# Patient Record
Sex: Male | Born: 1955 | Hispanic: Refuse to answer | Smoking: Current every day smoker
Health system: Southern US, Community
[De-identification: ages and names within clinical notes are randomized; demographics above are authoritative.]

---

## 2003-12-27 ENCOUNTER — Emergency Department (HOSPITAL_COMMUNITY): Admission: EM | Admit: 2003-12-27 | Discharge: 2003-12-27 | Payer: Self-pay | Admitting: Emergency Medicine
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
## 2009-08-13 ENCOUNTER — Ambulatory Visit: Payer: Self-pay | Admitting: Internal Medicine

## 2009-08-23 ENCOUNTER — Ambulatory Visit: Payer: Self-pay | Admitting: Internal Medicine

## 2021-12-30 ENCOUNTER — Other Ambulatory Visit: Payer: Self-pay | Admitting: Registered Nurse

## 2021-12-30 DIAGNOSIS — F17210 Nicotine dependence, cigarettes, uncomplicated: Secondary | ICD-10-CM

## 2021-12-30 DIAGNOSIS — Z136 Encounter for screening for cardiovascular disorders: Secondary | ICD-10-CM

## 2021-12-30 DIAGNOSIS — Z87891 Personal history of nicotine dependence: Secondary | ICD-10-CM

## 2021-12-31 ENCOUNTER — Ambulatory Visit: Payer: Self-pay

## 2022-01-01 ENCOUNTER — Ambulatory Visit: Payer: Self-pay

## 2022-01-21 ENCOUNTER — Inpatient Hospital Stay: Admission: RE | Admit: 2022-01-21 | Payer: Self-pay | Source: Ambulatory Visit

## 2022-01-27 ENCOUNTER — Ambulatory Visit (HOSPITAL_COMMUNITY)
Admission: EM | Admit: 2022-01-27 | Discharge: 2022-01-28 | Disposition: A | Discharge status: Other Acute Inpatient Hospital | Payer: Medicare HMO | Attending: Nurse Practitioner | Admitting: Nurse Practitioner

## 2022-01-27 DIAGNOSIS — Z20822 Contact with and (suspected) exposure to covid-19: Secondary | ICD-10-CM | POA: Insufficient documentation

## 2022-01-27 DIAGNOSIS — F1010 Alcohol abuse, uncomplicated: Secondary | ICD-10-CM | POA: Diagnosis not present

## 2022-01-27 DIAGNOSIS — F1990 Other psychoactive substance use, unspecified, uncomplicated: Secondary | ICD-10-CM | POA: Diagnosis present

## 2022-01-27 DIAGNOSIS — R45851 Suicidal ideations: Secondary | ICD-10-CM | POA: Diagnosis not present

## 2022-01-27 DIAGNOSIS — F323 Major depressive disorder, single episode, severe with psychotic features: Secondary | ICD-10-CM

## 2022-01-27 DIAGNOSIS — Z5900 Homelessness unspecified: Secondary | ICD-10-CM | POA: Diagnosis not present

## 2022-01-27 DIAGNOSIS — Z79899 Other long term (current) drug therapy: Secondary | ICD-10-CM | POA: Insufficient documentation

## 2022-01-27 DIAGNOSIS — F332 Major depressive disorder, recurrent severe without psychotic features: Secondary | ICD-10-CM | POA: Diagnosis present

## 2022-01-27 DIAGNOSIS — R251 Tremor, unspecified: Secondary | ICD-10-CM | POA: Diagnosis not present

## 2022-01-27 DIAGNOSIS — F209 Schizophrenia, unspecified: Secondary | ICD-10-CM | POA: Diagnosis present

## 2022-01-27 LAB — POCT URINE DRUG SCREEN - MANUAL ENTRY (I-SCREEN)
POC Amphetamine UR: NOT DETECTED
POC Buprenorphine (BUP): NOT DETECTED
POC Cocaine UR: POSITIVE — AB
POC Marijuana UR: POSITIVE — AB
POC Methadone UR: NOT DETECTED
POC Methamphetamine UR: NOT DETECTED
POC Morphine: NOT DETECTED
POC Oxazepam (BZO): NOT DETECTED
POC Oxycodone UR: NOT DETECTED
POC Secobarbital (BAR): NOT DETECTED

## 2022-01-27 LAB — POC SARS CORONAVIRUS 2 AG: SARSCOV2ONAVIRUS 2 AG: NEGATIVE

## 2022-01-27 MED ORDER — MELATONIN 3 MG PO TABS
3.0000 mg | ORAL_TABLET | Freq: Every day | ORAL | Status: DC
  Administered 2022-01-28: 3 mg via ORAL
  Filled 2022-01-27: qty 1

## 2022-01-27 MED ORDER — LOPERAMIDE HCL 2 MG PO CAPS: 2.0000 mg | ORAL_CAPSULE | ORAL | Status: DC | PRN

## 2022-01-27 MED ORDER — ADULT MULTIVITAMIN W/MINERALS CH
1.0000 | ORAL_TABLET | Freq: Every day | ORAL | Status: DC
  Administered 2022-01-28: 1 via ORAL
  Filled 2022-01-27: qty 1

## 2022-01-27 MED ORDER — TRAZODONE HCL 50 MG PO TABS: 50.0000 mg | ORAL_TABLET | Freq: Every evening | ORAL | Status: DC | PRN

## 2022-01-27 MED ORDER — ONDANSETRON 4 MG PO TBDP: 4.0000 mg | ORAL_TABLET | Freq: Four times a day (QID) | ORAL | Status: DC | PRN

## 2022-01-27 MED ORDER — ACETAMINOPHEN 325 MG PO TABS: 650.0000 mg | ORAL_TABLET | Freq: Four times a day (QID) | ORAL | Status: DC | PRN

## 2022-01-27 MED ORDER — MAGNESIUM HYDROXIDE 400 MG/5ML PO SUSP: 30.0000 mL | Freq: Every day | ORAL | Status: DC | PRN

## 2022-01-27 MED ORDER — ALUM & MAG HYDROXIDE-SIMETH 200-200-20 MG/5ML PO SUSP: 30.0000 mL | ORAL | Status: DC | PRN

## 2022-01-27 MED ORDER — THIAMINE HCL 100 MG PO TABS
100.0000 mg | ORAL_TABLET | Freq: Every day | ORAL | Status: DC
  Administered 2022-01-28: 100 mg via ORAL
  Filled 2022-01-27: qty 1

## 2022-01-27 MED ORDER — THIAMINE HCL 100 MG/ML IJ SOLN
100.0000 mg | Freq: Once | INTRAMUSCULAR | Status: AC
  Administered 2022-01-28: 100 mg via INTRAMUSCULAR
  Filled 2022-01-27: qty 2

## 2022-01-27 MED ORDER — LORAZEPAM 1 MG PO TABS
1.0000 mg | ORAL_TABLET | Freq: Four times a day (QID) | ORAL | Status: DC | PRN
  Administered 2022-01-28: 1 mg via ORAL
  Filled 2022-01-27: qty 1

## 2022-01-27 NOTE — ED Triage Notes (Signed)
Patient arrived at [facility] escorted by [employer]. Pt reports SI with a plan to stand in traffic. Per [employer] pt was staying at a hotel and the hotel manager called on his behalf because the pt was threatening suicide and attempted to run into the street. Pt states he constantly has auditory hallucinations with a commanding voice telling him to kill himself. Pt denies visual hallucinations at this moment. Pt is tearful during triage. Pt denies HI,NSSIB, and alcohol and drug use.

## 2022-01-27 NOTE — ED Provider Notes (Signed)
[facility] Continuous Assessment Admission H&P  Date: 01/27/22 Patient Name: Harry Jones MRN: [id number] Chief Complaint: " I was trying to kill myself".  Chief Complaint  Patient presents with   Schizophrenia   Suicidal      Diagnoses:  Final diagnoses:  Current severe episode of major depressive disorder with psychotic features, unspecified whether recurrent (HCC)  Suicidal ideation  Homelessness    HPI: Harry Jones is a 66 year old man who arrived voluntarily at the [facility] escorted by [employer] due to suicidal ideations with a plan to walk into traffic.  Pt reports he has a history of schizophrenia, Bipolar disorder and depression. Pt reports he became depressed seven years ago after his wife died. Pt reports he does not have any children or social support. Pt reports he sees a [facility] health provider at [facility] but is unable to recall her name or when he last visited. Pt is currently homeless and lives under a bridge for the past two years. Pt reports that he takes Xarelto 20 mg Po tabs daily " so my blood clots won't come back". Pt was also prescribed Cogentin 0.5 mg  daily tabs,  and eye drops, by the same provider at [facility]", but states " the demons told me not to take it".   Pt reports having constant command auditory hallucinations with voices telling him to kill himself and making him feel guilty if he didn't. Pt states " The voices say I didn't do it today, I didn't do what they told me to do". " I was on my way to the highway, I was trying to kill myself". Pt reports the voices come all the time and describes them as "demons".  Pt denies HI, denies VH.  The patient brought his current medication and states it was prescribed three weeks ago at " [facility]".  Pt reports his sleep and appetite as poor and states " I just want some help". " I want to get off the streets". Pt reports he drinks alcohol daily and does not count how many. Pt reports he takes cocaine, marijuana,  xanax and other drugs. Pt states " everybody who lives under the bridge do drugs and we just take whatever we can get, we don't count". Pt states he has been doing drugs for along time and regularly for the past two years he has lived under the bridge.   On evaluation, patient is alert, oriented x 2, guarded but cooperative. Speech is clear, and coherent. Pt appears disheveled with an odor. Eye contact is minimal. Mood is anxious, hopeless and depressed, affect is congruent with mood. Thought process and thought content is coherent. Pt endorses SI with plan, denies HI, endorses command AH, denies VH. There is indication that the patient is responding to internal stimuli. During this evaluation, the patient's head is in a downward gaze, and he waits for a few seconds after being asked a question before he responds. Pt also got up at one point in the assessment to rearrange the seat in the room and then sat down facing the wall with his arms clasped in his thighs. No delusions elicited during this assessment.    PHQ 2-9:     Total Time spent with patient: 20 minutes  Musculoskeletal  Strength & Muscle Tone:  tremors Gait & Station: normal Patient leans: N/A  Psychiatric Specialty Exam  Presentation General Appearance: Disheveled  Eye Contact:Minimal  Speech:Clear and Coherent  Speech Volume:Normal  Handedness:Right   Mood and  Affect  Mood:Anxious; Depressed; Hopeless  Affect:Congruent   Thought Process  Thought Processes:Coherent  Descriptions of Associations:Intact  Orientation:Partial  Thought Content:WDL  Diagnosis of Schizophrenia or Schizoaffective disorder in past: Yes  Duration of Psychotic Symptoms: Greater than six months  Hallucinations:Hallucinations: Auditory Description of Auditory Hallucinations: " They say I didn't do it today, I didn't do what they asked me, to kill myself".  Ideas of Reference:None  Suicidal Thoughts:Suicidal Thoughts: Yes, Active SI  Active Intent and/or Plan: With Intent; With Plan  Homicidal Thoughts:Homicidal Thoughts: No   Sensorium  Memory:Immediate Poor; Recent Poor  Judgment:Poor  Insight:Poor   Executive Functions  Concentration:Poor  Attention Span:Poor  Recall:Poor  Fund of Knowledge:Poor  Language:Fair   Psychomotor Activity  Psychomotor Activity:Psychomotor Activity: Normal   Assets  Assets:Communication Skills; Desire for Improvement   Sleep  Sleep:Sleep: Poor   Nutritional Assessment (For OBS and [facility] admissions only) Has the patient had a weight loss or gain of 10 pounds or more in the last 3 months?: No Has the patient had a decrease in food intake/or appetite?: No Does the patient have dental problems?: No Does the patient have eating habits or behaviors that may be indicators of an eating disorder including binging or inducing vomiting?: No Has the patient recently lost weight without trying?: 0 Has the patient been eating poorly because of a decreased appetite?: 0 Malnutrition Screening Tool Score: 0    Physical Exam Constitutional:      Appearance: He is not toxic-appearing or diaphoretic.  HENT:     Head: Normocephalic.     Right Ear: External ear normal.     Left Ear: External ear normal.     Nose: No congestion.  Eyes:     General:        Right eye: No discharge.        Left eye: Discharge present. Cardiovascular:     Rate and Rhythm: Normal rate.  Pulmonary:     Effort: No respiratory distress.  Chest:     Chest wall: No tenderness.  Neurological:     Mental Status: He is alert. Mental status is at baseline.  Psychiatric:        Attention and Perception: He perceives auditory hallucinations. He does not perceive visual hallucinations.        Mood and Affect: Mood is anxious and depressed.        Speech: Speech normal.        Behavior: Behavior is slowed. Behavior is cooperative.        Thought Content: Thought content is not paranoid or delusional.  Thought content includes suicidal ideation. Thought content does not include homicidal ideation. Thought content includes suicidal plan. Thought content does not include homicidal plan.        Judgment: Judgment is impulsive and inappropriate.    Review of Systems  Constitutional:  Negative for diaphoresis and fever.  HENT:  Negative for congestion.   Eyes:  Negative for pain and discharge.  Respiratory:  Negative for cough, shortness of breath and wheezing.   Cardiovascular:  Negative for chest pain and palpitations.  Gastrointestinal:  Negative for diarrhea, nausea and vomiting.  Neurological:  Positive for tremors. Negative for dizziness, seizures, loss of consciousness, weakness and headaches.  Psychiatric/Behavioral:  Positive for depression, hallucinations, substance abuse and suicidal ideas. The patient is nervous/anxious.     Blood pressure (!) 144/99, pulse 100, resp. rate 18, SpO2 92 %. There is no height or weight on file to calculate BMI.  Past Psychiatric History: Pt reports a history of schizophrenia, Bipolar and depression.  Is the patient at risk to self? Yes  Has the patient been a risk to self in the past 6 months? Yes .    Has the patient been a risk to self within the distant past? Yes   Is the patient a risk to others? No   Has the patient been a risk to others in the past 6 months? No   Has the patient been a risk to others within the distant past? No   Past Medical History: No past medical history on file.   Family History: No family history on file.  Social History:  Social History   Socioeconomic History   Marital status: Legally Separated    Spouse name: Not on file   Number of children: Not on file   Years of education: Not on file   Highest education level: Not on file  Occupational History   Not on file  Tobacco Use   Smoking status: Not on file   Smokeless tobacco: Not on file  Substance and Sexual Activity   Alcohol use: Not on file   Drug  use: Not on file   Sexual activity: Not on file  Other Topics Concern   Not on file  Social History Narrative   Not on file   Social Determinants of Health   Financial Resource Strain: Not on file  Food Insecurity: Not on file  Transportation Needs: Not on file  Physical Activity: Not on file  Stress: Not on file  Social Connections: Not on file  Intimate Partner Violence: Not on file    SDOH:  SDOH Screenings   Alcohol Screen: Not on file  Depression (PHQ2-9): Not on file  Financial Resource Strain: Not on file  Food Insecurity: Not on file  Housing: Not on file  Physical Activity: Not on file  Social Connections: Not on file  Stress: Not on file  Tobacco Use: Not on file  Transportation Needs: Not on file    Last Labs:  No visits with results within 6 Month(s) from this visit.  Latest known visit with results is:  No results found for any previous visit.    Allergies: Patient has no allergy information on record.  PTA Medications: (Not in a hospital admission)  Prior to Admission medications   Not on File    Medical Decision Making  Recommend in-patient psychiatric treatment for suicidal ideation, medication management and psychiatric services.  Pt will be admitted for continuous observation  for safety pending bed availability at the [facility]. [facility] notified. Pt agrees with plan for observation and admission while awaiting in-patient psych treatment.   Lab Orders         Resp Panel by RT-PCR (Flu A&B, Covid) Anterior Nasal Swab         CBC with Differential/Platelet         Comprehensive metabolic panel         Hemoglobin A1c         Lipid panel         TSH         Ethanol         POCT Urine Drug Screen - (I-Screen)      EKG.  Initiate CIWA protocol -lorazepam 1 mg every 6 hours prn for CIWA >10 -thiamine 100 mg daily for nutritional supplementation -hydroxyzine 25 mg every 6 hours prn for anxiety, CIWA < or = 10 -ondansetron 4  mg ODT every 6 hours  prn nausea/vomiting -loperamide 2-4 mg capsule prn diarrhea or loose stools      Recommendations  Based on my evaluation the patient does not appear to have an emergency medical condition.  Recommend in-patient psychiatric treatment. CIWA protocol for alcohol abuse.  Mancel Bale, NP 01/27/22  10:39 PM

## 2022-01-27 NOTE — Progress Notes (Addendum)
Received Harry Jones in the assessment room. He was cooperative with the admission process. Afterwards the skin assessment was completed and he was escorted to the unit. He was offered nourishments and accepted. He was standing at the bedside and verbalized the demons are telling him to stand there. He was tearful at an interval. He proceeded to verbalized he has been living under a bridge x2  years and the people he lives with is his family. His wife died 7 years ago of colon cancer and he gave up. He endorsed feeling suicidal related to his current situation. He stated his last drink was Friday and he drink 2 cases of beer with his  four friends.  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 He stated he paid on his wife  burial expenses. [facility] will give him a room next month when he can pay. His check is 1000.00 monthly. He cannot remember the medication he was taking for his diagnosis of schizophrenia, but he was taking Xarelto 20 mg QD for blood clots in his lungs. He has not been compliant with the medication. He does have the medication in his locker along with Cogentin 0.5 mg.He also has Dorzolamide 2%/0.5. He is hoping to go to a rehab facility.

## 2022-01-28 ENCOUNTER — Encounter (HOSPITAL_COMMUNITY): Payer: Self-pay | Admitting: Student

## 2022-01-28 ENCOUNTER — Other Ambulatory Visit: Payer: Self-pay

## 2022-01-28 ENCOUNTER — Inpatient Hospital Stay (HOSPITAL_COMMUNITY)
Admission: AD | Admit: 2022-01-28 | Discharge: 2022-02-05 | DRG: 885 | Disposition: A | Discharge status: Home / Self Care | Payer: Medicare HMO | Source: Intra-hospital | Attending: Psychiatry | Admitting: Psychiatry

## 2022-01-28 DIAGNOSIS — F1990 Other psychoactive substance use, unspecified, uncomplicated: Secondary | ICD-10-CM | POA: Diagnosis present

## 2022-01-28 DIAGNOSIS — F209 Schizophrenia, unspecified: Secondary | ICD-10-CM | POA: Diagnosis not present

## 2022-01-28 DIAGNOSIS — Z7901 Long term (current) use of anticoagulants: Secondary | ICD-10-CM

## 2022-01-28 DIAGNOSIS — F1010 Alcohol abuse, uncomplicated: Secondary | ICD-10-CM | POA: Diagnosis not present

## 2022-01-28 DIAGNOSIS — F17210 Nicotine dependence, cigarettes, uncomplicated: Secondary | ICD-10-CM | POA: Diagnosis present

## 2022-01-28 DIAGNOSIS — R45851 Suicidal ideations: Secondary | ICD-10-CM

## 2022-01-28 DIAGNOSIS — H409 Unspecified glaucoma: Secondary | ICD-10-CM | POA: Diagnosis present

## 2022-01-28 DIAGNOSIS — Z5900 Homelessness unspecified: Secondary | ICD-10-CM | POA: Diagnosis not present

## 2022-01-28 DIAGNOSIS — Z79899 Other long term (current) drug therapy: Secondary | ICD-10-CM

## 2022-01-28 DIAGNOSIS — F259 Schizoaffective disorder, unspecified: Principal | ICD-10-CM | POA: Diagnosis present

## 2022-01-28 DIAGNOSIS — Z5902 Unsheltered homelessness: Secondary | ICD-10-CM

## 2022-01-28 DIAGNOSIS — R319 Hematuria, unspecified: Secondary | ICD-10-CM | POA: Diagnosis present

## 2022-01-28 DIAGNOSIS — F323 Major depressive disorder, single episode, severe with psychotic features: Secondary | ICD-10-CM | POA: Diagnosis not present

## 2022-01-28 DIAGNOSIS — E569 Vitamin deficiency, unspecified: Secondary | ICD-10-CM | POA: Diagnosis present

## 2022-01-28 LAB — LIPID PANEL
Cholesterol: 187 mg/dL (ref 0–200)
HDL: 79 mg/dL (ref >40)
LDL Cholesterol: 99 mg/dL (ref 0–99)
Total CHOL/HDL Ratio: 2.4 RATIO
Triglycerides: 46 mg/dL (ref <150)
VLDL: 9 mg/dL (ref 0–40)

## 2022-01-28 LAB — CBC WITH DIFFERENTIAL/PLATELET
Abs Immature Granulocytes: 0.02 10*3/uL (ref 0.00–0.07)
Basophils Absolute: 0 10*3/uL (ref 0.0–0.1)
Basophils Relative: 0 %
Eosinophils Absolute: 0.1 10*3/uL (ref 0.0–0.5)
Eosinophils Relative: 1 %
HCT: 46.5 % (ref 39.0–52.0)
Hemoglobin: 15.5 g/dL (ref 13.0–17.0)
Immature Granulocytes: 0 %
Lymphocytes Relative: 29 %
Lymphs Abs: 2.9 10*3/uL (ref 0.7–4.0)
MCH: 33 pg (ref 26.0–34.0)
MCHC: 33.3 g/dL (ref 30.0–36.0)
MCV: 99.1 fL (ref 80.0–100.0)
Monocytes Absolute: 1 10*3/uL (ref 0.1–1.0)
Monocytes Relative: 10 %
Neutro Abs: 6 10*3/uL (ref 1.7–7.7)
Neutrophils Relative %: 60 %
Platelets: 177 10*3/uL (ref 150–400)
RBC: 4.69 MIL/uL (ref 4.22–5.81)
RDW: 13.8 % (ref 11.5–15.5)
WBC: 9.9 10*3/uL (ref 4.0–10.5)
nRBC: 0 % (ref 0.0–0.2)

## 2022-01-28 LAB — HEMOGLOBIN A1C
Hgb A1c MFr Bld: 5.5 % (ref 4.8–5.6)
Mean Plasma Glucose: 111.15 mg/dL

## 2022-01-28 LAB — ETHANOL: Alcohol, Ethyl (B): <10 mg/dL (ref <10)

## 2022-01-28 LAB — COMPREHENSIVE METABOLIC PANEL
ALT: 10 U/L (ref 0–44)
AST: 20 U/L (ref 15–41)
Albumin: 4.1 g/dL (ref 3.5–5.0)
Alkaline Phosphatase: 48 U/L (ref 38–126)
Anion gap: 7 (ref 5–15)
BUN: 5 mg/dL — ABNORMAL LOW (ref 8–23)
CO2: 26 mmol/L (ref 22–32)
Calcium: 9.7 mg/dL (ref 8.9–10.3)
Chloride: 106 mmol/L (ref 98–111)
Creatinine, Ser: 1.14 mg/dL (ref 0.61–1.24)
GFR, Estimated: >60 mL/min (ref >60)
Glucose, Bld: 101 mg/dL — ABNORMAL HIGH (ref 70–99)
Potassium: 4.2 mmol/L (ref 3.5–5.1)
Sodium: 139 mmol/L (ref 135–145)
Total Bilirubin: 0.9 mg/dL (ref 0.3–1.2)
Total Protein: 7 g/dL (ref 6.5–8.1)

## 2022-01-28 LAB — RESP PANEL BY RT-PCR (FLU A&B, COVID) ARPGX2
Influenza A by PCR: NEGATIVE
Influenza B by PCR: NEGATIVE
SARS Coronavirus 2 by RT PCR: NEGATIVE

## 2022-01-28 LAB — TSH: TSH: 2.889 u[IU]/mL (ref 0.350–4.500)

## 2022-01-28 MED ORDER — MELATONIN 3 MG PO TABS: 3.0000 mg | ORAL_TABLET | Freq: Every day | ORAL | 0 refills | Status: AC

## 2022-01-28 MED ORDER — ACETAMINOPHEN 325 MG PO TABS: 650.0000 mg | ORAL_TABLET | Freq: Four times a day (QID) | ORAL | Status: DC | PRN

## 2022-01-28 MED ORDER — BENZTROPINE MESYLATE 0.5 MG PO TABS
0.5000 mg | ORAL_TABLET | Freq: Every day | ORAL | Status: DC
  Administered 2022-01-28 – 2022-01-31 (×4): 0.5 mg via ORAL
  Filled 2022-01-28 (×8): qty 1

## 2022-01-28 MED ORDER — ADULT MULTIVITAMIN W/MINERALS CH
1.0000 | ORAL_TABLET | Freq: Every day | ORAL | Status: DC
  Administered 2022-01-29 – 2022-02-05 (×8): 1 via ORAL
  Filled 2022-01-28 (×2): qty 1
  Filled 2022-01-28: qty 7
  Filled 2022-01-28: qty 1
  Filled 2022-01-28: qty 7
  Filled 2022-01-28 (×6): qty 1

## 2022-01-28 MED ORDER — RIVAROXABAN 20 MG PO TABS
20.0000 mg | ORAL_TABLET | Freq: Every day | ORAL | Status: DC
  Administered 2022-01-29 – 2022-02-05 (×8): 20 mg via ORAL
  Filled 2022-01-28 (×7): qty 1
  Filled 2022-01-28 (×2): qty 7
  Filled 2022-01-28 (×3): qty 1

## 2022-01-28 MED ORDER — ALUM & MAG HYDROXIDE-SIMETH 200-200-20 MG/5ML PO SUSP: 30.0000 mL | ORAL | Status: DC | PRN

## 2022-01-28 MED ORDER — NICOTINE 21 MG/24HR TD PT24: 21.0000 mg | MEDICATED_PATCH | Freq: Every day | TRANSDERMAL | Status: DC

## 2022-01-28 MED ORDER — MELATONIN 3 MG PO TABS
3.0000 mg | ORAL_TABLET | Freq: Every day | ORAL | Status: DC
  Administered 2022-01-28 – 2022-02-04 (×8): 3 mg via ORAL
  Filled 2022-01-28: qty 1
  Filled 2022-01-28: qty 7
  Filled 2022-01-28 (×2): qty 1
  Filled 2022-01-28: qty 7
  Filled 2022-01-28 (×6): qty 1

## 2022-01-28 MED ORDER — DORZOLAMIDE HCL-TIMOLOL MAL 2-0.5 % OP SOLN
1.0000 [drp] | Freq: Two times a day (BID) | OPHTHALMIC | Status: DC
  Administered 2022-01-28: 1 [drp] via OPHTHALMIC
  Filled 2022-01-28: qty 10

## 2022-01-28 MED ORDER — ONDANSETRON 4 MG PO TBDP: 4.0000 mg | ORAL_TABLET | Freq: Four times a day (QID) | ORAL | Status: AC | PRN

## 2022-01-28 MED ORDER — LOPERAMIDE HCL 2 MG PO CAPS: 2.0000 mg | ORAL_CAPSULE | ORAL | Status: AC | PRN

## 2022-01-28 MED ORDER — NICOTINE POLACRILEX 2 MG MT GUM
2.0000 mg | CHEWING_GUM | OROMUCOSAL | Status: DC | PRN
Start: 2022-01-28 — End: 2022-01-28

## 2022-01-28 MED ORDER — MAGNESIUM HYDROXIDE 400 MG/5ML PO SUSP: 30.0000 mL | Freq: Every day | ORAL | Status: DC | PRN

## 2022-01-28 MED ORDER — HYDROXYZINE HCL 25 MG PO TABS
25.0000 mg | ORAL_TABLET | Freq: Four times a day (QID) | ORAL | Status: AC | PRN
  Administered 2022-01-28 – 2022-01-30 (×4): 25 mg via ORAL
  Filled 2022-01-28 (×5): qty 1

## 2022-01-28 MED ORDER — LORAZEPAM 1 MG PO TABS
1.0000 mg | ORAL_TABLET | Freq: Four times a day (QID) | ORAL | Status: AC | PRN
Start: 2022-01-28 — End: 2022-01-31

## 2022-01-28 MED ORDER — DORZOLAMIDE HCL-TIMOLOL MAL 2-0.5 % OP SOLN
1.0000 [drp] | Freq: Two times a day (BID) | OPHTHALMIC | Status: DC
Start: 2022-01-28 — End: 2022-02-05
  Administered 2022-01-28 – 2022-02-05 (×16): 1 [drp] via OPHTHALMIC
  Filled 2022-01-28: qty 10

## 2022-01-28 MED ORDER — ADULT MULTIVITAMIN W/MINERALS CH
1.0000 | ORAL_TABLET | Freq: Every day | ORAL | Status: AC
Start: 2022-01-29 — End: ?

## 2022-01-28 MED ORDER — THIAMINE HCL 100 MG PO TABS
100.0000 mg | ORAL_TABLET | Freq: Every day | ORAL | Status: AC
Start: 2022-01-29 — End: ?

## 2022-01-28 MED ORDER — NICOTINE POLACRILEX 2 MG MT GUM: 2.0000 mg | CHEWING_GUM | OROMUCOSAL | Status: DC | PRN

## 2022-01-28 MED ORDER — THIAMINE HCL 100 MG PO TABS
100.0000 mg | ORAL_TABLET | Freq: Every day | ORAL | Status: DC
Start: 2022-01-28 — End: 2022-02-05
  Administered 2022-01-29 – 2022-02-05 (×8): 100 mg via ORAL
  Filled 2022-01-28 (×7): qty 1
  Filled 2022-01-28: qty 7
  Filled 2022-01-28 (×5): qty 1

## 2022-01-28 MED ORDER — RIVAROXABAN 20 MG PO TABS
20.0000 mg | ORAL_TABLET | Freq: Every day | ORAL | Status: DC
  Administered 2022-01-28: 20 mg via ORAL
  Filled 2022-01-28: qty 1

## 2022-01-28 MED ORDER — NICOTINE POLACRILEX 2 MG MT GUM: 2.0000 mg | CHEWING_GUM | OROMUCOSAL | 0 refills | Status: DC | PRN

## 2022-01-28 NOTE — ED Notes (Signed)
Pt awakened from nap for CIWA to be performed. Requested a bag of chips and juice, given per request. Now sitting up on the side of the bed eating. No distress observed. Safety maintained and will continue to monitor.

## 2022-01-28 NOTE — ED Notes (Signed)
[facility] [employer] called for transportation services to [facility]. 

## 2022-01-28 NOTE — Progress Notes (Signed)
The focus of this group is to help patients review their daily goal of treatment and discuss progress on daily workbooks.  Pt attended the evening group and responded to all discussion prompts from the Writer. Pt shared that today was an "okay" day. This being his first day here, Pt was oriented to unit rules and expectations, which he verbalized understanding.  Harry Jones told the group about how he sought to begin a new chapter in his life and was happy to be here. "I want to change the persons, places, and things in my life for the better. This is the start of that."  Pt rated his day a 7 out of 10 and his affect was appropriate.

## 2022-01-28 NOTE — ED Provider Notes (Addendum)
FBC/OBS ASAP Discharge Summary  Date and Time: 01/28/2022 1:45 PM  Name: Harry Jones  MRN:  [id number]   Discharge Diagnoses:  Final diagnoses:  Current severe episode of major depressive disorder with psychotic features, unspecified whether recurrent (HCC)  Suicidal ideation  Homelessness  Schizophrenia, unspecified type (HCC)  Alcohol abuse  Polysubstance use disorder   Subjective: Harry Jones is a 66 y.o. male, with PMH MDD with psychotic features, alcohol use disorder, cocaine use disorder, cannabis use disorder, housing instability, nicotine dependence who presented voluntary to [facility] (01/28/2022) via [facility] from the hotel after the manager called because patient was endorsing active SI to walk into traffic.   Inciting event: Unclear, patient was not forthcoming on events leading up to his presentation Current stressors: Polysubstance use, housing instability, financial   " Perseveration (vague and not forthcoming with thought content. perseverated on having cah and vh of demons that tell him to kill himself. contradricting statements of cavh that has "been the same all my life, but has worsened" - unable to describe what is worse)   Patient narrative: " I am here for mental health, having thoughts of suicide, the voices telling me to kill myself.  The voices has been the same my entire life, last night her that was yesterday."  In response to how the command auditory hallucinations are worse than they have been in the past, patient stated "I told you they have been the same, telling me to kill myself".  When asked what happened that led to patient seeking help yesterday, vs. last week if his command auditory hallucinations, SI and depressions have been the same, patient was unable to to answer the questions directly.  Patient then stated that his command auditory hallucination has been worse.  Although he is unable to pinpoint what is worse about them.  Patient  avoided answering questions about sequence of events that happened yesterday multiple times.  Patient initially was unable to describe his command auditory hallucinations, then patient added that he is having visual hallucinations as well.  Patient was unable to describe them initially, then stated that he is seeing demons.  Patient reported that his last SI was yesterday, however patient then stated that he had SI "just now" when this author asked patient about what his goals for management and treatment are.  Patient stated that his biggest concern is "getting to a facility so I can be monitored, I get a check for thousand dollars in a month".  Patient wanted monitoring for "my mental health", and declined to further elaborate what about his mental health he was seeking treatment for.  Rather patient perseverated on vague command auditory and visual hallucinations and would not engage in further evaluation. Patient reported that his appetite and sleep are improving, declined to answer specifics about it.   Patient reported that he has been off of any medications for >10 years now.  That he takes Xarelto and Cogentin, "when I feel like it".   Patient denied HI.   Patient denied for this author and team to contact anyone for collateral information  ASSESSMENT/PLAN: [name] is a 66 y.o. male, with PMH MDD with psychotic features, alcohol use disorder, cocaine use disorder, cannabis use disorder, housing instability, nicotine dependence who presented voluntary to [facility] (01/28/2022) via [facility] from the hotel after the manager called because patient was endorsing active SI to walk into traffic.    MDD with psychotic features  in the setting of polysubstance use There is concern for secondary gain, as patient declined multiple times to engage with evaluation, and is vague with symptoms of psychosis.  Additionally, patient is A&Ox 3, thought process organized and coherent.  No  evidence of internal preoccupation nor disorganized speech nor paranoid delusions.  However patient continues to perseverate on having command auditory hallucinations along with visual hallucinations that tell him to kill himself.  Voluntary admission to observation [facility] Dispo: Inpatient psychiatric hospitalization  CSW has been contacted, currently awaiting bed availability We will restart patient's home Xarelto We will consider starting Risperdal low-dose for patient psychosis. "  Stay Summary:  Behavior the patient was pleasant and cooperative.  Engaged with evaluation.  Endorsed chronic command auditory hallucinations and visual hallucinations.  Reported active SI per above.  Patient is alert and oriented, able to give coherent history, although vague with details.  Speech and thought process organized.   On day of transfer, patient was recorded with a CIWA of 26 for auditory and visual hallucinations and tremor.  However patient's CIWA was falsely elevated, given that patient's auditory and visual hallucinations are chronic and the patient has a diagnosis of a thought disorder with psychosis.  Also patient's tremor is unilateral, mainly in his right hand at rest.  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  Patient had no signs or symptoms of emergent alcohol withdrawal, patient was normotensive, heart rate within normal limits, respiratory rate within normal limits, afebrile.  Patient was not diaphoretic.  He was A&Ox3, with good attention and concentration.  Patient was not delirious.  Patient did receive as needed Ativan per CIWA protocol. Given this, I am unconcerned that patient will go into severe alcohol withdrawal with seizures or DT.  However prior to admission to [facility], patient will have CIWA re-taken.   Total Time spent with patient: 45 minutes  Past Psychiatric History: See H&P Past Medical History: History  reviewed. No pertinent past medical history. History reviewed. No pertinent surgical history. Family History: History reviewed. No pertinent family history. Family Psychiatric History: See H&P Social History:  Social History   Substance and Sexual Activity  Alcohol Use None     Social History   Substance and Sexual Activity  Drug Use Not on file    Social History   Socioeconomic History   Marital status: Legally Separated    Spouse name: Not on file   Number of children: Not on file   Years of education: Not on file   Highest education level: Not on file  Occupational History   Not on file  Tobacco Use   Smoking status: Not on file   Smokeless tobacco: Not on file  Substance and Sexual Activity   Alcohol use: Not on file   Drug use: Not on file   Sexual activity: Not on file  Other Topics Concern   Not on file  Social History Narrative   Not on file   Social Determinants of Health   Financial Resource Strain: Not on file  Food Insecurity: Not on file  Transportation Needs: Not on file  Physical Activity: Not on file  Stress: Not on file  Social Connections: Not on file   SDOH:  SDOH Screenings   Alcohol Screen: Not on file  Depression (JYN8-2): Not on file  Financial Resource Strain: Not on file  Food Insecurity: Not on file  Housing: Not on file  Physical  Activity: Not on file  Social Connections: Not on file  Stress: Not on file  Tobacco Use: Not on file  Transportation Needs: Not on file    Tobacco Cessation:  A prescription for an FDA-approved tobacco cessation medication provided at discharge  Current Medications:  Current Facility-Administered Medications  Medication Dose Route Frequency Provider Last Rate Last Admin   acetaminophen (TYLENOL) tablet 650 mg  650 mg Oral Q6H PRN Onuoha, Chinwendu V, NP       alum & mag hydroxide-simeth (MAALOX/MYLANTA) 200-200-20 MG/5ML suspension 30 mL  30 mL Oral Q4H PRN Onuoha, Chinwendu V, NP        dorzolamide-timolol (COSOPT) 22.3-6.8 MG/ML ophthalmic solution 1 drop  1 drop Both Eyes BID Princess Bruins, DO       loperamide (IMODIUM) capsule 2-4 mg  2-4 mg Oral PRN Onuoha, Chinwendu V, NP       LORazepam (ATIVAN) tablet 1 mg  1 mg Oral Q6H PRN Onuoha, Chinwendu V, NP   1 mg at 01/28/22 1203   magnesium hydroxide (MILK OF MAGNESIA) suspension 30 mL  30 mL Oral Daily PRN Onuoha, Chinwendu V, NP       melatonin tablet 3 mg  3 mg Oral QHS Onuoha, Chinwendu V, NP   3 mg at 01/28/22 0014   multivitamin with minerals tablet 1 tablet  1 tablet Oral Daily Onuoha, Chinwendu V, NP   1 tablet at 01/28/22 1002   nicotine polacrilex (NICORETTE) gum 2 mg  2 mg Oral PRN Princess Bruins, DO       ondansetron (ZOFRAN-ODT) disintegrating tablet 4 mg  4 mg Oral Q6H PRN Onuoha, Chinwendu V, NP       rivaroxaban (XARELTO) tablet 20 mg  20 mg Oral Daily Princess Bruins, DO   20 mg at 01/28/22 1317   thiamine tablet 100 mg  100 mg Oral Daily Onuoha, Chinwendu V, NP   100 mg at 01/28/22 1002   Current Outpatient Medications  Medication Sig Dispense Refill   benztropine (COGENTIN) 0.5 MG tablet Take 0.5 mg by mouth daily. (Patient not taking: Reported on 01/28/2022)     dorzolamide-timolol (COSOPT) 22.3-6.8 MG/ML ophthalmic solution Place 1 drop into both eyes 2 (two) times daily.     risperidone (RISPERDAL) 4 MG tablet Take 4 mg by mouth at bedtime. (Patient not taking: Reported on 01/28/2022)     XARELTO 20 MG TABS tablet Take 20 mg by mouth daily.      PTA Medications: (Not in a hospital admission)       No data to display          Flowsheet Row ED from 01/27/2022 in [facility]  C-SSRS RISK CATEGORY High Risk       Musculoskeletal  Strength & Muscle Tone: within normal limits Gait & Station: normal Patient leans: N/A  Psychiatric Specialty Exam  Presentation  General Appearance: Disheveled (matted hair, poor dentition)  Eye Contact:Minimal (looking off in a different  location. not darting)  Speech:Clear and Coherent; Normal Rate  Speech Volume:Decreased  Handedness:Right   Mood and Affect  Mood:Depressed  Affect:Congruent; Constricted   Thought Process  Thought Processes:Goal Directed; Coherent  Descriptions of Associations:Circumstantial  Orientation:Full (Time, Place and Person)  Thought Content:Perseveration (vague and not forthcoming with thought content. perseverated on having cah and vh of demons that tell him to kill himself. contradricting statements of cavh that has "been the same all my life, but has worsened" - unable to describe what is worse)  Diagnosis of Schizophrenia or Schizoaffective disorder in past: No    Hallucinations:Hallucinations: Visual; Auditory; Command Description of Command Hallucinations: See thought content Description of Auditory Hallucinations: See thought content Description of Visual Hallucinations: See thought content  Ideas of Reference:None  Suicidal Thoughts:Suicidal Thoughts: Yes, Active SI Active Intent and/or Plan: With Means to Carry Out; With Access to Means; Without Intent (Plan to run into traffic.  Patient denied intent, saying that he does not want to die.)  Homicidal Thoughts:Homicidal Thoughts: No   Sensorium  Memory:Immediate Fair  Judgment:Fair  Insight:Fair   Executive Functions  Concentration:Good  Attention Span:Good  Recall:Fair  Fund of Knowledge:Fair  Language:Fair   Psychomotor Activity  Psychomotor Activity:Psychomotor Activity: Normal   Assets  Assets:Communication Skills; Desire for Improvement; Resilience; Leisure Time   Sleep  Sleep:Sleep: Good   Nutritional Assessment (For OBS and FBC admissions only) Has the patient had a weight loss or gain of 10 pounds or more in the last 3 months?: No Has the patient had a decrease in food intake/or appetite?: No Does the patient have dental problems?: No Does the patient have eating habits or behaviors  that may be indicators of an eating disorder including binging or inducing vomiting?: No Has the patient recently lost weight without trying?: 0 Has the patient been eating poorly because of a decreased appetite?: 0 Malnutrition Screening Tool Score: 0    Physical Exam  Physical Exam Vitals and nursing note reviewed.  HENT:     Head: Normocephalic and atraumatic.  Pulmonary:     Effort: Pulmonary effort is normal. No respiratory distress.  Neurological:     General: No focal deficit present.     Mental Status: He is alert and oriented to person, place, and time.     Gait: Gait normal.    Review of Systems  Respiratory:  Negative for shortness of breath.   Cardiovascular:  Negative for chest pain.  Gastrointestinal:  Negative for nausea and vomiting.  Neurological:  Negative for dizziness.   Blood pressure 132/89, pulse 76, temperature 98.2 F (36.8 C), temperature source Oral, resp. rate 20, SpO2 95 %. There is no height or weight on file to calculate BMI.  Demographic Factors:  Male, Age 74 or older, Low socioeconomic status, Living alone, and Unemployed  Loss Factors: Financial problems/change in socioeconomic status  Historical Factors: Impulsivity  Risk Reduction Factors:   Religious beliefs about death  Continued Clinical Symptoms:  Depression:   Comorbid alcohol abuse/dependence Impulsivity Severe Alcohol/Substance Abuse/Dependencies Unstable or Poor Therapeutic Relationship Previous Psychiatric Diagnoses and Treatments  Cognitive Features That Contribute To Risk:  Closed-mindedness, Loss of executive function, Polarized thinking, and Thought constriction (tunnel vision)    Suicide Risk:  Severe:  Frequent, intense, and enduring suicidal ideation, specific plan, no subjective intent, but some objective markers of intent (i.e., choice of lethal method), the method is accessible, some limited preparatory behavior, evidence of impaired self-control, severe  dysphoria/symptomatology, multiple risk factors present, and few if any protective factors, particularly a lack of social support.  Plan Of Care/Follow-up recommendations:  Activity and diet at tolerated. See patient discharge instruction for further details.   Disposition: inpatient psych - [facility]  Princess Bruins, DO 01/28/2022, 1:45 PM

## 2022-01-28 NOTE — Tx Team (Signed)
Initial Treatment Plan 01/28/2022 6:39 PM Harry Jones [id number]    PATIENT STRESSORS: Financial difficulties   Loss of significant relationship "wife died 7 years ago"   Substance abuse     PATIENT STRENGTHS: Capable of independent living  Financial means  Religious Affiliation  Supportive family/friends    PATIENT IDENTIFIED PROBLEMS: Altered perceptions (+AVH, paranoid & suspicious) "the voices are telling me that you are not going to help me".    Substance Abuse "I've been drinking since I was 66 y/o. I last used cocaine & weed last Friday"    Suicide risk---SI with plan to walk into traffic    Homeless--[place]ives [place] bridge         DISCHARGE CRITERIA:  Improved stabilization in mood, thinking, and/or behavior Verbal commitment to aftercare and medication compliance Withdrawal symptoms are absent or subacute and managed without 24-hour nursing intervention  PRELIMINARY DISCHARGE PLAN: Outpatient therapy Placement in alternative living arrangements  PATIENT/FAMILY INVOLVEMENT: This treatment plan has been presented to and reviewed with the patient, Harry Jones. The patient have been given the opportunity to ask questions and make suggestions.  Sherryl Manges, RN 01/28/2022, 6:39 PM

## 2022-01-28 NOTE — Progress Notes (Signed)
Pt stated AVH same, pt visible on the unit much of the evening    01/28/22 2200  Psych Admission Type (Psych Patients Only)  Admission Status Voluntary  Psychosocial Assessment  Patient Complaints Anxiety  Eye Contact Fair  Facial Expression Anxious  Affect Appropriate to circumstance  Speech Logical/coherent  Interaction Cautious  Motor Activity Slow  Appearance/Hygiene Disheveled;Body odor;Layered clothes  Behavior Characteristics Cooperative  Mood Preoccupied;Suspicious  Aggressive Behavior  Effect No apparent injury  Thought Process  Coherency Circumstantial  Content Blaming others  Delusions Paranoid  Perception Hallucinations  Hallucination Auditory;Visual  Judgment Impaired  Confusion None  Danger to Self  Current suicidal ideation? Denies  Danger to Others  Danger to Others None reported or observed

## 2022-01-28 NOTE — ED Notes (Signed)
Discharge instructions provided and Pt stated understanding. Pt alert, orient and ambulatory prior to d/c from facility. Personal belongings returned from locker number 25. Eye drops sent in envelope w/EMTALA. Given to driver. Safe transport called for transportation services. Pt escorted to the sally port. Safety maintained.

## 2022-01-28 NOTE — ED Notes (Signed)
PRN Ativan admin according to CIWA score of 26, admin w/much encouragement. Pt was reluctant to approach the window at the nurses desk. Stated that he was told not to get close to the window. Staff informed Pt that it was ok to approach the window. Meal provided to Pt. Safety maintained and will continue to monitor.

## 2022-01-28 NOTE — ED Notes (Signed)
Currently with provider. Breakfast to be offered afterwards and morning meds.

## 2022-01-28 NOTE — ED Notes (Signed)
Report called to [facility]. Pt can arrive after 1630.

## 2022-01-28 NOTE — ED Notes (Signed)
Pt again difficult w/approaching the window for med admin. Approached after informing Pt that is was ok to get closer. After med admin, continued to rest on pull out chair/bed. Safety maintained and will continue to monitor.

## 2022-01-28 NOTE — Progress Notes (Signed)
Pt admitted to [facility] from [facility] where he presented with SI, plan to stand in traffic after his hotel manager called. Per nursing report, chart review and pt assessment. He's a 66 y/o [place] male, polysubstance abuse (etoh, cocaine &THC). He's currently homeless, [place] bridge; depressed, paranoid, suspicious with chronic AVH. Wife passed 7 years ago and he states he has a set of twin daughters 71 y/o whom he listed for collateral and emergency contact. Pt presents agitated, irritable, guarded, minimal and remains suspicious and paranoid "I don't know if I can trust you" on initial interactions. Required multiple prompts to complete assessment questions. Denies SI, HI and pain. Continues to endorse +AVH "They voices are laughing at me, telling me y'all can't help me". He denies all form of abuse "We don't do that. Men stay with women and women go with men". Reports long term etoh use "since I was 66 y/o. I drink whatever is there". States he last drank, used [facility] and cocaine all on last Friday". Tremors noted in bilateral hands during assessment. Last UDS on 01/27/22 positive for cocaine and THC. Receives a $1,000 in disability funds but use it to support his habit "I share with my neighbor". Skin assessment done, skin is intact. Dark areas noted on right lower extremity as though he had an ulcer in the past "I don't know". Items deemed contraband secured in locker. Ambulatory to unit with slow but steady gait. Unit orientation done, routines discussed, care plan reviewed and admission documents signed. Safety checks initiated at Q 15 minutes intervals without self harm gestures. Emotional support, reassurance and encouragement provided to pt.  He tolerated meal and fluids well when offered. Denies concerns at this time.

## 2022-01-28 NOTE — Discharge Instructions (Addendum)
To see which pharmacy near you is the CHEAPEST for certain medications, please use GoodRx. It is free website and has a free phone app.      Also consider looking at [facility]00 or [facility] $7.00 prescription list. Both are free to view if googled "[facility] prescription" and "[facility] $7 prescription". These are set prices, no insurance required.   For issues with sleep, please use this free app for insomnia called CBT-I. Let your doctors and therapists know so they can help with extra tips and tricks or for guidance and accountability. NO ADDS on the app.     For therapy outside the hospital, please ask for these specific types of therapy: CBT   Please make regular appointments with an outpatient psychiatrist and other doctors once you leave the hospital.    Suicide hotline: 988 Emergency: 911   See below for additional and specific details.  

## 2022-01-28 NOTE — ED Notes (Signed)
Pt was given cereal  

## 2022-01-28 NOTE — ED Notes (Signed)
Breakfast provided and Pt accepted scheduled morning meds w/o difficulty. Pt walked away from nurses station during med admin. Stated that the voices were telling him, "They are not going to do anything to help you. They are laughing at you". Reassured Pt that nobody was laughing at him and that we were glad that he came in to get help. Encouraged Pt to eat his breakfast and then possibly take a shower to feel better. Pt currently sitting on the side of the pull out bed/chair not eating his breakfast. Safety maintained and will continue to monitor.

## 2022-01-28 NOTE — ED Provider Notes (Signed)
Behavioral Health Progress Note  Date and Time: 01/28/2022 12:14 PM Name: Harry Jones MRN:  [id number]  Subjective:  Harry Jones is a 66 y.o. male, with PMH MDD with psychotic features, alcohol use disorder, cocaine use disorder, cannabis use disorder, housing instability, nicotine dependence who presented voluntary to [facility] (01/28/2022) via [facility] from the hotel after the manager called because patient was endorsing active SI to walk into traffic.  Inciting event: Unclear, patient was not forthcoming on events leading up to his presentation Current stressors: Polysubstance use, housing instability, financial  Perseveration (vague and not forthcoming with thought content. perseverated on having cah and vh of demons that tell him to kill himself. contradricting statements of cavh that has "been the same all my life, but has worsened" - unable to describe what is worse)   Patient narrative: " I am here for mental health, having thoughts of suicide, the voices telling me to kill myself.  The voices has been the same my entire life, last night her that was yesterday."  In response to how the command auditory hallucinations are worse than they have been in the past, patient stated "I told you they have been the same, telling me to kill myself".  When asked what happened that led to patient seeking help yesterday, vs. last week if his command auditory hallucinations, SI and depressions have been the same, patient was unable to to answer the questions directly.  Patient then stated that his command auditory hallucination has been worse.  Although he is unable to pinpoint what is worse about them.  Patient avoided answering questions about sequence of events that happened yesterday multiple times.  Patient initially was unable to describe his command auditory hallucinations, then patient added that he is having visual hallucinations as well.  Patient was unable to describe them  initially, then stated that he is seeing demons.  Patient reported that his last SI was yesterday, however patient then stated that he had SI "just now" when this author asked patient about what his goals for management and treatment are.  Patient stated that his biggest concern is "getting to a facility so I can be monitored, I get a check for thousand dollars in a month".  Patient wanted monitoring for "my mental health", and declined to further elaborate what about his mental health he was seeking treatment for.  Rather patient perseverated on vague command auditory and visual hallucinations and would not engage in further evaluation. Patient reported that his appetite and sleep are improving, declined to answer specifics about it.  Patient reported that he has been off of any medications for >10 years now.  That he takes Xarelto and Cogentin, "when I feel like it".  Patient denied HI.  Patient denied for this author and team to contact anyone for collateral information   ASSESSMENT/PLAN: Harry Jones is a 67 y.o. male, with PMH MDD with psychotic features, alcohol use disorder, cocaine use disorder, cannabis use disorder, housing instability, nicotine dependence who presented voluntary to [facility] (01/28/2022) via GPD from the hotel after the manager called because patient was endorsing active SI to walk into traffic.   MDD with psychotic features in the setting of polysubstance use There is concern for secondary gain, as patient declined multiple times to engage with evaluation, and is vague with symptoms of psychosis.  Additionally, patient is A&Ox 3, thought process organized and coherent.  No evidence of internal preoccupation nor disorganized  speech nor paranoid delusions.  However patient continues to perseverate on having command auditory hallucinations along with visual hallucinations that tell him to kill himself.  Voluntary admission to observation [facility] Dispo:  Inpatient psychiatric hospitalization  CSW has been contacted, currently awaiting bed availability We will restart patient's home Xarelto We will consider starting Risperdal low-dose for patient psychosis.  Diagnosis:  Final diagnoses:  Current severe episode of major depressive disorder with psychotic features, unspecified whether recurrent (HCC)  Suicidal ideation  Homelessness  Schizophrenia, unspecified type (HCC)  Alcohol abuse  Polysubstance use disorder    Total Time spent with patient: 45 minutes  Past Psychiatric History: See H&P Past Medical History:  History reviewed. No pertinent past medical history. History reviewed. No pertinent surgical history. Family History: History reviewed. No pertinent family history. Family Psychiatric  History: See H&P Social History:  Social History   Substance and Sexual Activity  Alcohol Use None     Social History   Substance and Sexual Activity  Drug Use Not on file    Social History   Socioeconomic History   Marital status: Legally Separated    Spouse name: Not on file   Number of children: Not on file   Years of education: Not on file   Highest education level: Not on file  Occupational History   Not on file  Tobacco Use   Smoking status: Not on file   Smokeless tobacco: Not on file  Substance and Sexual Activity   Alcohol use: Not on file   Drug use: Not on file   Sexual activity: Not on file  Other Topics Concern   Not on file  Social History Narrative   Not on file   Social Determinants of Health   Financial Resource Strain: Not on file  Food Insecurity: Not on file  Transportation Needs: Not on file  Physical Activity: Not on file  Stress: Not on file  Social Connections: Not on file   SDOH:  SDOH Screenings   Alcohol Screen: Not on file  Depression (PHQ2-9): Not on file  Financial Resource Strain: Not on file  Food Insecurity: Not on file  Housing: Not on file  Physical Activity: Not on file   Social Connections: Not on file  Stress: Not on file  Tobacco Use: Not on file  Transportation Needs: Not on file   Additional Social History:    Pain Medications: None Prescriptions: Pt says he has medications he brought in.  He is not familiar with them. Over the Counter: Unknwn History of alcohol / drug use?: Yes Withdrawal Symptoms: None (Pt denies any withdrawal symptoms although his hands were shaky when signing paperwork.) Name of Substance 1: ETOH 1 - Age of First Use: 66 years of age 39 - Amount (size/oz): Varies "sun up to sun down" 1 - Frequency: Daily 1 - Duration: ongoing 1 - Last Use / Amount: One week ago 1 - Method of Aquiring: Purchase 1- Route of Use: oral                  Sleep: Good  Appetite:  Fair  Current Medications:  Current Facility-Administered Medications  Medication Dose Route Frequency Provider Last Rate Last Admin   acetaminophen (TYLENOL) tablet 650 mg  650 mg Oral Q6H PRN Onuoha, Chinwendu V, NP       alum & mag hydroxide-simeth (MAALOX/MYLANTA) 200-200-20 MG/5ML suspension 30 mL  30 mL Oral Q4H PRN Onuoha, Chinwendu V, NP       loperamide (  IMODIUM) capsule 2-4 mg  2-4 mg Oral PRN Onuoha, Chinwendu V, NP       LORazepam (ATIVAN) tablet 1 mg  1 mg Oral Q6H PRN Onuoha, Chinwendu V, NP   1 mg at 01/28/22 1203   magnesium hydroxide (MILK OF MAGNESIA) suspension 30 mL  30 mL Oral Daily PRN Onuoha, Chinwendu V, NP       melatonin tablet 3 mg  3 mg Oral QHS Onuoha, Chinwendu V, NP   3 mg at 01/28/22 0014   multivitamin with minerals tablet 1 tablet  1 tablet Oral Daily Onuoha, Chinwendu V, NP   1 tablet at 01/28/22 1002   ondansetron (ZOFRAN-ODT) disintegrating tablet 4 mg  4 mg Oral Q6H PRN Onuoha, Chinwendu V, NP       thiamine tablet 100 mg  100 mg Oral Daily Onuoha, Chinwendu V, NP   100 mg at 01/28/22 1002   Current Outpatient Medications  Medication Sig Dispense Refill   benztropine (COGENTIN) 0.5 MG tablet Take 0.5 mg by mouth daily.  (Patient not taking: Reported on 01/28/2022)     dorzolamide-timolol (COSOPT) 22.3-6.8 MG/ML ophthalmic solution Place 1 drop into both eyes 2 (two) times daily.     risperidone (RISPERDAL) 4 MG tablet Take 4 mg by mouth at bedtime. (Patient not taking: Reported on 01/28/2022)     traZODone (DESYREL) 50 MG tablet Take 50 mg by mouth at bedtime as needed for sleep. (Patient not taking: Reported on 01/28/2022)     XARELTO 20 MG TABS tablet Take 20 mg by mouth daily.      Labs  Lab Results:  Admission on 01/27/2022  Component Date Value Ref Range Status   SARS Coronavirus 2 by RT PCR 01/27/2022 NEGATIVE  NEGATIVE Final   Comment: (NOTE) SARS-CoV-2 target nucleic acids are NOT DETECTED.  The SARS-CoV-2 RNA is generally detectable in upper respiratory specimens during the acute phase of infection. The lowest concentration of SARS-CoV-2 viral copies this assay can detect is 138 copies/mL. A negative result does not preclude SARS-Cov-2 infection and should not be used as the sole basis for treatment or other patient management decisions. A negative result may occur with  improper specimen collection/handling, submission of specimen other than nasopharyngeal swab, presence of viral mutation(s) within the areas targeted by this assay, and inadequate number of viral copies(<138 copies/mL). A negative result must be combined with clinical observations, patient history, and epidemiological information. The expected result is Negative.  Fact Sheet for Patients:  BloggerCourse.com  Fact Sheet for Healthcare Providers:  SeriousBroker.it  This test is no                          t yet approved or cleared by the [place] FDA and  has been authorized for detection and/or diagnosis of SARS-CoV-2 by FDA under an Emergency Use Authorization (EUA). This EUA will remain  in effect (meaning this test can be used) for the duration of the COVID-19  declaration under Section 564(b)(1) of the Act, 21 U.S.C.section 360bbb-3(b)(1), unless the authorization is terminated  or revoked sooner.       Influenza A by PCR 01/27/2022 NEGATIVE  NEGATIVE Final   Influenza B by PCR 01/27/2022 NEGATIVE  NEGATIVE Final   Comment: (NOTE) The Xpert Xpress SARS-CoV-2/FLU/RSV plus assay is intended as an aid in the diagnosis of influenza from Nasopharyngeal swab specimens and should not be used as a sole basis for treatment. Nasal washings and aspirates are  unacceptable for Xpert Xpress SARS-CoV-2/FLU/RSV testing.  Fact Sheet for Patients: BloggerCourse.com  Fact Sheet for Healthcare Providers: SeriousBroker.it  This test is not yet approved or cleared by the [place] FDA and has been authorized for detection and/or diagnosis of SARS-CoV-2 by FDA under an Emergency Use Authorization (EUA). This EUA will remain in effect (meaning this test can be used) for the duration of the COVID-19 declaration under Section 564(b)(1) of the Act, 21 U.S.C. section 360bbb-3(b)(1), unless the authorization is terminated or revoked.  Performed at [facility], [address] [place], [place] [address]    WBC 01/27/2022 9.9  4.0 - 10.5 K/uL Final   RBC 01/27/2022 4.69  4.22 - 5.81 MIL/uL Final   Hemoglobin 01/27/2022 15.5  13.0 - 17.0 g/dL Final   HCT 78/24/2353 46.5  39.0 - 52.0 % Final   MCV 01/27/2022 99.1  80.0 - 100.0 fL Final   MCH 01/27/2022 33.0  26.0 - 34.0 pg Final   MCHC 01/27/2022 33.3  30.0 - 36.0 g/dL Final   RDW 61/44/3154 13.8  11.5 - 15.5 % Final   Platelets 01/27/2022 177  150 - 400 K/uL Final   nRBC 01/27/2022 0.0  0.0 - 0.2 % Final   Neutrophils Relative % 01/27/2022 60  % Final   Neutro Abs 01/27/2022 6.0  1.7 - 7.7 K/uL Final   Lymphocytes Relative 01/27/2022 29  % Final   Lymphs Abs 01/27/2022 2.9  0.7 - 4.0 K/uL Final   Monocytes Relative 01/27/2022 10  % Final    Monocytes Absolute 01/27/2022 1.0  0.1 - 1.0 K/uL Final   Eosinophils Relative 01/27/2022 1  % Final   Eosinophils Absolute 01/27/2022 0.1  0.0 - 0.5 K/uL Final   Basophils Relative 01/27/2022 0  % Final   Basophils Absolute 01/27/2022 0.0  0.0 - 0.1 K/uL Final   Immature Granulocytes 01/27/2022 0  % Final   Abs Immature Granulocytes 01/27/2022 0.02  0.00 - 0.07 K/uL Final   Performed at [facility], [address], [place], [place] [address]   Sodium 01/27/2022 139  135 - 145 mmol/L Final   Potassium 01/27/2022 4.2  3.5 - 5.1 mmol/L Final   Chloride 01/27/2022 106  98 - 111 mmol/L Final   CO2 01/27/2022 26  22 - 32 mmol/L Final   Glucose, Bld 01/27/2022 101 (H)  70 - 99 mg/dL Final   Glucose reference range applies only to samples taken after fasting for at least 8 hours.   BUN 01/27/2022 5 (L)  8 - 23 mg/dL Final   Creatinine, Ser 01/27/2022 1.14  0.61 - 1.24 mg/dL Final   Calcium 61/95/0932 9.7  8.9 - 10.3 mg/dL Final   Total Protein 67/06/4579 7.0  6.5 - 8.1 g/dL Final   Albumin 99/83/3825 4.1  3.5 - 5.0 g/dL Final   AST 05/39/7673 20  15 - 41 U/L Final   ALT 01/27/2022 10  0 - 44 U/L Final   Alkaline Phosphatase 01/27/2022 48  38 - 126 U/L Final   Total Bilirubin 01/27/2022 0.9  0.3 - 1.2 mg/dL Final   GFR, Estimated 01/27/2022 >60  >60 mL/min Final   Comment: (NOTE) Calculated using the CKD-EPI Creatinine Equation (2021)    Anion gap 01/27/2022 7  5 - 15 Final   Performed at [facility], [address], [place], [place] [address]   Hgb A1c MFr Bld 01/27/2022 5.5  4.8 - 5.6 % Final   Comment: (NOTE)  Pre diabetes:          5.7%-6.4%  Diabetes:              >6.4%  Glycemic control for   <7.0% adults with diabetes    Mean Plasma Glucose 01/27/2022 111.15  mg/dL Final   Performed at [facility], [address] [place], [place] [address]   TSH 01/27/2022 2.889  0.350 - 4.500 uIU/mL Final   Comment: Performed by a 3rd Generation assay with a functional  sensitivity of <=0.01 uIU/mL. Performed at [facility], [address] [place], [place] [address]    POC Amphetamine UR 01/27/2022 None Detected  NONE DETECTED (Cut Off Level 1000 ng/mL) Final   POC Secobarbital (BAR) 01/27/2022 None Detected  NONE DETECTED (Cut Off Level 300 ng/mL) Final   POC Buprenorphine (BUP) 01/27/2022 None Detected  NONE DETECTED (Cut Off Level 10 ng/mL) Final   POC Oxazepam (BZO) 01/27/2022 None Detected  NONE DETECTED (Cut Off Level 300 ng/mL) Final   POC Cocaine UR 01/27/2022 Positive (A)  NONE DETECTED (Cut Off Level 300 ng/mL) Final   POC Methamphetamine UR 01/27/2022 None Detected  NONE DETECTED (Cut Off Level 1000 ng/mL) Final   POC Morphine 01/27/2022 None Detected  NONE DETECTED (Cut Off Level 300 ng/mL) Final   POC Methadone UR 01/27/2022 None Detected  NONE DETECTED (Cut Off Level 300 ng/mL) Final   POC Oxycodone UR 01/27/2022 None Detected  NONE DETECTED (Cut Off Level 100 ng/mL) Final   POC Marijuana UR 01/27/2022 Positive (A)  NONE DETECTED (Cut Off Level 50 ng/mL) Final   Alcohol, Ethyl (B) 01/27/2022 <10  <10 mg/dL Final   Comment: (NOTE) Lowest detectable limit for serum alcohol is 10 mg/dL.  For medical purposes only. Performed at [facility], [address] [place], [place] [address]    SARSCOV2ONAVIRUS 2 AG 01/27/2022 NEGATIVE  NEGATIVE Final   Comment: (NOTE) SARS-CoV-2 antigen NOT DETECTED.   Negative results are presumptive.  Negative results do not preclude SARS-CoV-2 infection and should not be used as the sole basis for treatment or other patient management decisions, including infection  control decisions, particularly in the presence of clinical signs and  symptoms consistent with COVID-19, or in those who have been in contact with the virus.  Negative results must be combined with clinical observations, patient history, and epidemiological information. The expected result is Negative.  Fact Sheet for Patients:  https://www.jennings-kim.com/https://www.fda.gov/media/141569/download  Fact Sheet for Healthcare Providers: https://alexander-rogers.biz/https://www.fda.gov/media/141568/download  This test is not yet approved or cleared by the [place] FDA and  has been authorized for detection and/or diagnosis of SARS-CoV-2 by FDA under an Emergency Use Authorization (EUA).  This EUA will remain in effect (meaning this test can be used) for the duration of  the COV                          ID-19 declaration under Section 564(b)(1) of the Act, 21 U.S.C. section 360bbb-3(b)(1), unless the authorization is terminated or revoked sooner.     Cholesterol 01/27/2022 187  0 - 200 mg/dL Final   Triglycerides 46/96/295207/10/2021 46  <150 mg/dL Final   HDL 84/13/244007/10/2021 79  >40 mg/dL Final   Total CHOL/HDL Ratio 01/27/2022 2.4  RATIO Final   VLDL 01/27/2022 9  0 - 40 mg/dL Final   LDL Cholesterol 01/27/2022 99  0 - 99 mg/dL Final   Comment:  Total Cholesterol/HDL:CHD Risk Coronary Heart Disease Risk Table                     Men   Women  1/2 Average Risk   3.4   3.3  Average Risk       5.0   4.4  2 X Average Risk   9.6   7.1  3 X Average Risk  23.4   11.0        Use the calculated Patient Ratio above and the CHD Risk Table to determine the patient's CHD Risk.        ATP III CLASSIFICATION (LDL):  <100     mg/dL   Optimal  161-096  mg/dL   Near or Above                    Optimal  130-159  mg/dL   Borderline  045-409  mg/dL   High  >811     mg/dL   Very High Performed at [facility], [address] [place], [place] [address]     Blood Alcohol level:  Lab Results  Component Value Date   ETH <10 01/27/2022    Metabolic Disorder Labs: Lab Results  Component Value Date   HGBA1C 5.5 01/27/2022   MPG 111.15 01/27/2022   No results found for: "PROLACTIN" Lab Results  Component Value Date   CHOL 187 01/27/2022   TRIG 46 01/27/2022   HDL 79 01/27/2022   CHOLHDL 2.4 01/27/2022   VLDL 9 01/27/2022   LDLCALC 99 01/27/2022    Therapeutic  Lab Levels: No results found for: "LITHIUM" No results found for: "VALPROATE" No results found for: "CBMZ"  Physical Findings   Flowsheet Row ED from [facility]/27/2022 in [facility]  C-SSRS RISK CATEGORY High Risk        Musculoskeletal  Strength & Muscle Tone: within normal limits Gait & Station: normal Patient leans: N/A  Psychiatric Specialty Exam  Presentation  General Appearance: Disheveled (matted hair, poor dentition)   Eye Contact:Minimal (looking off in a different location. not darting)   Speech:Clear and Coherent; Normal Rate   Speech Volume:Decreased   Handedness:Right    Mood and Affect  Mood:Depressed   Affect:Congruent; Constricted    Thought Process  Thought Processes:Goal Directed; Coherent   Descriptions of Associations:Circumstantial   Orientation:Full (Time, Place and Person)   Thought Content:Perseveration (vague and not forthcoming with thought content. perseverated on having cah and vh of demons that tell him to kill himself. contradricting statements of cavh that has "been the same all my life, but has worsened" - unable to describe what is worse)   Diagnosis of Schizophrenia or Schizoaffective disorder in past: No     Hallucinations:Hallucinations: Visual; Auditory; Command Description of Command Hallucinations: See thought content Description of Auditory Hallucinations: See thought content Description of Visual Hallucinations: See thought content   Ideas of Reference:None   Suicidal Thoughts:Suicidal Thoughts: Yes, Active SI Active Intent and/or Plan: With Means to Carry Out; With Access to Means; Without Intent (Plan to run into traffic.  Patient denied intent, saying that he does not want to die.)   Homicidal Thoughts:Homicidal Thoughts: No    Sensorium  Memory:Immediate Fair   Judgment:Fair   Insight:Fair    Executive Functions  Concentration:Good   Attention  Span:Good   Recall:Fair   Fund of Knowledge:Fair   Language:Fair    Psychomotor Activity  Psychomotor Activity:Psychomotor Activity: Normal  Assets  Assets:Communication Skills; Desire for Improvement; Resilience; Leisure Time    Sleep  Sleep:Sleep: Good    Nutritional Assessment (For OBS and [facility] admissions only) Has the patient had a weight loss or gain of 10 pounds or more in the last 3 months?: No Has the patient had a decrease in food intake/or appetite?: No Does the patient have dental problems?: No Does the patient have eating habits or behaviors that may be indicators of an eating disorder including binging or inducing vomiting?: No Has the patient recently lost weight without trying?: 0 Has the patient been eating poorly because of a decreased appetite?: 0 Malnutrition Screening Tool Score: 0     Physical Exam  Physical Exam Vitals and nursing note reviewed.  HENT:     Head: Normocephalic and atraumatic.  Pulmonary:     Effort: Pulmonary effort is normal. No respiratory distress.  Neurological:     General: No focal deficit present.     Mental Status: He is alert.    Review of Systems  Respiratory:  Negative for shortness of breath.   Cardiovascular:  Negative for chest pain.  Gastrointestinal:  Negative for nausea and vomiting.  Neurological:  Negative for dizziness and headaches.   Blood pressure 132/89, pulse 76, temperature 98.2 F (36.8 C), temperature source Oral, resp. rate 20, SpO2 95 %. There is no height or weight on file to calculate BMI.  Treatment Plan Summary: Daily contact with patient to assess and evaluate symptoms and progress in treatment and Medication management  Princess Bruins, DO 01/28/2022 12:14 PM

## 2022-01-28 NOTE — Progress Notes (Signed)
Per [facility] Brook McNichol, RN pt is under review however due to pt's CIWA level pt will  be re-evaluated. Oncoming [facility] AC Joslyn Devon, RN to follow up with CSW and care team.   Maryjean Ka, MSW, [facility] 01/28/2022 2:03 PM

## 2022-01-28 NOTE — Progress Notes (Signed)
Pt was accepted to [facility] 01/28/22; Bed Assignment 503-1  Pt meets inpatient criteria per Princess Bruins, DO  Attending Physician will be Dr. Elsie Saas  Report can be called to: [phone]  Pt can arrive after 4:30pm   Care Team Notified: [facility] Joslyn Devon, RN, Princess Bruins, DO, Laretta Alstrom. LPN, Malva Limes, RN.  Kelton Pillar, LCSWA 01/28/2022 @ 4:02 PM

## 2022-01-29 DIAGNOSIS — F259 Schizoaffective disorder, unspecified: Principal | ICD-10-CM

## 2022-01-29 DIAGNOSIS — R45851 Suicidal ideations: Secondary | ICD-10-CM

## 2022-01-29 DIAGNOSIS — Z5900 Homelessness unspecified: Secondary | ICD-10-CM

## 2022-01-29 LAB — RPR: RPR Ser Ql: NONREACTIVE

## 2022-01-29 MED ORDER — OLANZAPINE 5 MG PO TBDP: 5.0000 mg | ORAL_TABLET | Freq: Three times a day (TID) | ORAL | Status: DC | PRN

## 2022-01-29 MED ORDER — HALOPERIDOL LACTATE 5 MG/ML IJ SOLN: 5.0000 mg | Freq: Three times a day (TID) | INTRAMUSCULAR | Status: DC | PRN

## 2022-01-29 MED ORDER — QUETIAPINE FUMARATE 25 MG PO TABS
25.0000 mg | ORAL_TABLET | Freq: Every day | ORAL | Status: DC
  Administered 2022-01-29 – 2022-01-31 (×3): 25 mg via ORAL
  Filled 2022-01-29 (×6): qty 1

## 2022-01-29 MED ORDER — LORAZEPAM 1 MG PO TABS: 1.0000 mg | ORAL_TABLET | ORAL | Status: DC | PRN

## 2022-01-29 MED ORDER — HALOPERIDOL 5 MG PO TABS: 5.0000 mg | ORAL_TABLET | Freq: Three times a day (TID) | ORAL | Status: DC | PRN

## 2022-01-29 MED ORDER — ZIPRASIDONE MESYLATE 20 MG IM SOLR: 20.0000 mg | INTRAMUSCULAR | Status: DC | PRN

## 2022-01-29 MED ORDER — BENZTROPINE MESYLATE 1 MG PO TABS: 1.0000 mg | ORAL_TABLET | Freq: Two times a day (BID) | ORAL | Status: DC | PRN

## 2022-01-29 MED ORDER — QUETIAPINE FUMARATE 25 MG PO TABS
25.0000 mg | ORAL_TABLET | Freq: Every day | ORAL | Status: DC
  Administered 2022-01-29: 25 mg via ORAL
  Filled 2022-01-29 (×2): qty 1

## 2022-01-29 MED ORDER — BENZTROPINE MESYLATE 1 MG/ML IJ SOLN: 2.0000 mg | Freq: Two times a day (BID) | INTRAMUSCULAR | Status: DC | PRN

## 2022-01-29 NOTE — [facility] Suicide Risk Assessment (Signed)
[facility] INPATIENT:  Family/Significant Other Suicide Prevention Education  Suicide Prevention Education:  Patient Refusal for Family/Significant Other Suicide Prevention Education: The patient Harry Jones has refused to provide written consent for family/significant other to be provided Family/Significant Other Suicide Prevention Education during admission and/or prior to discharge.  Physician notified.  Breauna Mazzeo E Zachariah Pavek 01/29/2022, 10:20 AM

## 2022-01-29 NOTE — Progress Notes (Signed)
   01/29/22 0515  Sleep  Number of Hours 6.75

## 2022-01-29 NOTE — BHH Counselor (Signed)
Adult Comprehensive Assessment  Patient ID: Harry Jones, male   DOB: 05/15/1956, 66 y.o.   MRN: [id number]  Information Source: Information source: Patient  Current Stressors:  Patient states their primary concerns and needs for treatment are:: Patient states that he was trying to kill himself. Patient states their goals for this hospitilization and ongoing recovery are:: Patient would like for voices to stop Educational / Learning stressors: no stressors Employment / Job issues: no stressors Family Relationships: patient states he has no family Surveyor, quantity / Lack of resources (include bankruptcy): patient gets $1000/month Housing / Lack of housing: patient currently lives under a bridge for the past 2 years Physical health (include injuries & life threatening diseases): "okay" Social relationships: other homeless people in the community Substance abuse: alcohol, marijuana, crack/cocaine Bereavement / Loss: no stressors  Living/Environment/Situation:  Living Arrangements: Non-relatives/Friends Living conditions (as described by patient or guardian): Patient lives under a bridge in the community Who else lives in the home?: other homeless man under the bridge How long has patient lived in current situation?: 2 years What is atmosphere in current home: Chaotic, Dangerous  Family History:  Marital status: Widowed Widowed, when?: 7 years ago Are you sexually active?: No What is your sexual orientation?: patient declined to answer Has your sexual activity been affected by drugs, alcohol, medication, or emotional stress?: patient declined to answer Does patient have children?: No  Childhood History:  By whom was/is the patient raised?: Both parents Additional childhood history information: "i don't know" Description of patient's relationship with caregiver when they were a child: "okay" Patient's description of current relationship with people who raised him/her: caretakers no longer  living How were you disciplined when you got in trouble as a child/adolescent?: "i don't know" Does patient have siblings?: Yes Number of Siblings: 3 Description of patient's current relationship with siblings: patient states that he does not know where they reside Did patient suffer any verbal/emotional/physical/sexual abuse as a child?: No Did patient suffer from severe childhood neglect?: No Has patient ever been sexually abused/assaulted/raped as an adolescent or adult?: No Was the patient ever a victim of a crime or a disaster?: No Patient description of being a victim of a crime or disaster: N/A Witnessed domestic violence?: No Has patient been affected by domestic violence as an adult?: No  Education:  Highest grade of school patient has completed: 9th grade Currently a student?: No Learning disability?: Yes What learning problems does patient have?: "I don't know"  Employment/Work Situation:   Employment Situation: On disability Why is Patient on Disability: Mental Health How Long has Patient Been on Disability: 18-24 months. Patient's Job has Been Impacted by Current Illness: No What is the Longest Time Patient has Held a Job?: "I don't know" Where was the Patient Employed at that Time?: "I don't know" Has Patient ever Been in the Military?: No  Financial Resources:   Financial resources: Insurance claims handler Does patient have a Lawyer or guardian?: No  Alcohol/Substance Abuse:   What has been your use of drugs/alcohol within the last 12 months?: marijuana, crack cocaine, alcohol- patient uses his money to assist with substance use If attempted suicide, did drugs/alcohol play a role in this?: No Alcohol/Substance Abuse Treatment Hx: Past Tx, Inpatient If yes, describe treatment: "a long time ago, I don't know" Has alcohol/substance abuse ever caused legal problems?: No  Social Support System:   Patient's Community Support System: None Describe Community  Support System: patient reports that he does not have anyone  Type of faith/religion: "I believe in a higher power" How does patient's faith help to cope with current illness?: nothing  Leisure/Recreation:   Do You Have Hobbies?: No  Strengths/Needs:   What is the patient's perception of their strengths?: "I don't know" Patient states they can use these personal strengths during their treatment to contribute to their recovery: "I don't know" Patient states these barriers may affect/interfere with their treatment: "I don't know" Patient states these barriers may affect their return to the community: "I don't know" Other important information patient would like considered in planning for their treatment: "I don't know"  Discharge Plan:   Currently receiving community mental health services: No Patient states concerns and preferences for aftercare planning are: "I want to remain in treatment facility" Patient states they will know when they are safe and ready for discharge when: "when I stop hearing voices" Does patient have access to transportation?: No Does patient have financial barriers related to discharge medications?: No Plan for no access to transportation at discharge: CSW will continue to assess Will patient be returning to same living situation after discharge?: Yes  Summary/Recommendations:   Summary and Recommendations (to be completed by the evaluator): Harry Jones is a 66 year old male who was admitted to [facility] after having suicidal thoughts and hearing voices telling him to harm himself.  Patient endorses crack/cocaine use, marijuana use and alcohol use.  Patient gets $1000/month from disability and uses money to fund his substance use.  Patient currently lives under a bridge and has lived there for 2 years.  Patient states that he has no contact with family or any social supports. Patient has no outpatient mental health follow up.  While here, Harry Jones can benefit from crisis  stabilization, medication management, therapeutic milieu, and referrals for services.  Harry Jones Harry Jones Harry Jones. 01/29/2022

## 2022-01-29 NOTE — Progress Notes (Signed)
Pt agitated and upset about getting labs done due to making pt remember "when I was a junkie"

## 2022-01-29 NOTE — BHH Suicide Risk Assessment (Signed)
[facility] Admission Suicide Risk Assessment   Nursing information obtained from:  Patient Demographic factors:  Male, Age 65 or older, Low socioeconomic status, Unemployed, Divorced or widowed Current Mental Status:  Self-harm thoughts Loss Factors:  Loss of significant relationship, Financial problems / change in socioeconomic status (Wife died 7 yrs ago) Historical Factors:  Impulsivity Risk Reduction Factors:  Positive social support ("I'm trying to live for my twin daughters")  Total Time spent with patient: 20 minutes Principal Problem: Schizoaffective disorder (HCC) Diagnosis:  Principal Problem:   Schizoaffective disorder (HCC) Active Problems:   Homelessness   Suicidal ideation  Subjective Data:   Harry Jones is a 66 year old male with a self-reported past psychiatric history of schizophrenia, bipolar disorder, and depression.  There is very little information available about this patient in the chart previous to 2 days ago.  He was transferred from the behavioral health urgent care to the [facility] behavioral health hospital due to suicidal thoughts with a plan to walk into traffic.  He has also been reporting command auditory hallucinations.  He is admitted on a voluntary basis.   Mode of transport to Hospital: [facility] to [facility] initially Current Outpatient (Home) Medication List: Cogentin, eye drops, and Xarelto PRN medication prior to evaluation: NA   ED course: uneventful, ED provider (Dr. Cyndie Chime) noted concern for secondary gain Collateral Information: patient denies collateral contact POA/Legal Guardian: unknown   HPI:    Per chart review, the patient presented to the [facility] behavioral health urgent care on 7/4 via the [facility].  Per reports, a hotel manager called the police because the patient was reporting suicidal thoughts with a plan to walk into traffic.  At the behavioral health urgent care, the patient endorsed chronic command auditory hallucinations and  visual hallucinations.  There is no evidence of internal preoccupation or responding to internal stimuli.  Dr. Cyndie Chime was concerned for secondary gain given that the patient would not engage with evaluation and was vague in his description of psychosis.   Of note, there is no documentation available for this patient before 7/4.  No significant documentation is listed in care everywhere.   The patient is seen in the late morning at the [facility] behavioral health hospital on 7/6.  He is finishing up his conversation with the Child psychotherapist.  She asks him to sign some paperwork.  The patient appears concerned that he is being discharged and yells at the social worker "if you discharge me I will jump off a bridge".  He repeats this several times.  Interview with the patient after this is difficult.  He appears very suspicious and guarded and is unwilling to divulge autobiographical details.  At the same time I question if there is some cognitive impairment contributing to his inability to remember details from his life.  He is willing to state that he lives under a bridge somewhere with a group of people.  He states that he desires to get better in order to help this group of people.   Unable to perform psychiatric review of symptoms given the patient's unwillingness to engage in interview.   On approach later in the day, the patient is willing to start low-dose Seroquel for his reported hallucinations.   Past Psychiatric Hx: Unable to collect   Substance Abuse Hx: Alcohol: Unable to collect Tobacco: Unable to collect Illicit drugs: UDS positive for cocaine and THC   Past Medical History: Unable to collect   Family History: Unable to collect   Social  History: Unable to collect   Continued Clinical Symptoms:  Alcohol Use Disorder Identification Test Final Score (AUDIT): 21 The "Alcohol Use Disorders Identification Test", Guidelines for Use in Primary Care, Second Edition.  World Environmental consultant [facility]). Score between 0-7:  no or low risk or alcohol related problems. Score between 8-15:  moderate risk of alcohol related problems. Score between 16-19:  high risk of alcohol related problems. Score 20 or above:  warrants further diagnostic evaluation for alcohol dependence and treatment.   CLINICAL FACTORS:   Currently Psychotic   Musculoskeletal: Strength & Muscle Tone: within normal limits Gait & Station: normal Patient leans: N/A  Psychiatric Specialty Exam:   Presentation  General Appearance: Disheveled   Eye Contact:Poor   Speech:Clear and Coherent   Speech Volume:Decreased   Handedness:Right     Mood and Affect  Mood:Labile   Affect:Labile     Thought Process  Thought Processes:Linear   Duration of Psychotic Symptoms: -- (unknown)   Past Diagnosis of Schizophrenia or Psychoactive disorder: -- (unknown)   Descriptions of Associations:Circumstantial   Orientation:Full (Time, Place and Person)   Thought Content:Perseveration; Paranoid Ideation   Hallucinations patient reports unspecified AH Ideas of Reference:None   Suicidal Thoughts:Suicidal Thoughts: Yes, Active SI Active Intent and/or Plan: With Plan   Homicidal Thoughts:Homicidal Thoughts: No     Sensorium  Memory:Immediate Poor; Recent Poor; Remote Poor   Judgment:Poor   Insight:Poor     Executive Functions  Concentration:Poor   Attention Span:Poor   Recall:Poor   Fund of Knowledge:Poor   Language:Poor     Psychomotor Activity  Psychomotor Activity:Psychomotor Activity: Normal     Assets  Assets:Communication Skills; Desire for Improvement; Resilience; Leisure Time     Sleep  Sleep:Sleep: Fair    Physical Exam: Physical Exam Constitutional:      Appearance: the patient is not toxic-appearing.  Pulmonary:     Effort: Pulmonary effort is normal.  Neurological:     General: No focal deficit present.     Mental Status: the patient is alert and  oriented to person, place, and time.   Review of Systems  Respiratory:  Negative for shortness of breath.   Cardiovascular:  Negative for chest pain.  Gastrointestinal:  Negative for abdominal pain, constipation, diarrhea, nausea and vomiting.  Neurological:  Negative for headaches.   Blood pressure 121/79, pulse 73, temperature 98.3 F (36.8 C), temperature source Oral, resp. rate 20, height 6\' 5"  (1.956 m), weight 102.1 kg, SpO2 94 %. Body mass index is 26.68 kg/m.   COGNITIVE FEATURES THAT CONTRIBUTE TO RISK:  None    SUICIDE RISK:  Moderate: patient with unknown Hx of suicide attempts presenting with reported psychotic symptoms and suicidal thoughts with a plan. Presently the patient has some reported social support.   PLAN OF CARE:   Safety and Monitoring:             --  Voluntary admission to inpatient psychiatric unit for safety, stabilization and treatment             -- Daily contact with patient to assess and evaluate symptoms and progress in treatment             -- Patient's case to be discussed in multi-disciplinary team meeting             -- Observation Level : q15 minute checks             -- Vital signs:  q12 hours             --  Precautions: suicide, elopement, and assault   2. Psychiatric Diagnoses and Treatment:  Schizoaffective disorder, unspecified type             -- Start Seroquel 25 mg daily and 25 mg QHS for psychosis             -- Continue home Cogentin 0.5 mg daily -- The risks/benefits/side-effects/alternatives to this medication were discussed in detail with the patient and time was given for questions. The patient consents to medication trial.              -- Metabolic profile and EKG monitoring obtained while on an atypical antipsychotic (BMI: 27 Lipid Panel: WNL HbgA1c: WNL QTc: 442)              -- Encouraged patient to participate in unit milieu and in scheduled group therapies              --Agitation protocol with Haldol 5 mg PO/IM and  Cogentin PO/IM             -- Short Term Goals: Ability to identify changes in lifestyle to reduce recurrence of condition will improve and Ability to verbalize feelings will improve             -- Long Term Goals: Improvement in symptoms so as ready for discharge                3. Medical Issues Being Addressed:              Tobacco Use Disorder             -- Nicotine patch 21mg /24 hours ordered             -- Smoking cessation encouraged             VTE or other coagulopathy             -- Continue home Xarelto   4. Discharge Planning:              -- Social work and case management to assist with discharge planning and identification of hospital follow-up needs prior to discharge             -- Estimated LOS: 5-7 days             -- Discharge Concerns: Need to establish a safety plan; Medication compliance and effectiveness             -- Discharge Goals: Return home with outpatient referrals for mental health follow-up including medication management/psychotherapy   I certify that inpatient services furnished can reasonably be expected to improve the patient's condition.    01-13-1983, MD 01/29/2022, 1:52 PM

## 2022-01-29 NOTE — Progress Notes (Signed)
   01/29/22 2000  Psych Admission Type (Psych Patients Only)  Admission Status Voluntary  Psychosocial Assessment  Patient Complaints Anxiety;Suspiciousness  Eye Contact Fair  Facial Expression Anxious  Affect Appropriate to circumstance  Speech Logical/coherent  Interaction Cautious  Motor Activity Slow  Appearance/Hygiene Disheveled;Body odor;Layered clothes  Behavior Characteristics Anxious  Mood Anxious  Aggressive Behavior  Effect No apparent injury  Thought Process  Coherency Circumstantial  Content Blaming others  Delusions Paranoid  Perception Hallucinations  Hallucination Auditory;Visual  Judgment Impaired  Confusion None  Danger to Self  Current suicidal ideation? Denies  Danger to Others  Danger to Others None reported or observed

## 2022-01-29 NOTE — Group Note (Signed)
Recreation Therapy Group Note   Group Topic:Communication  Group Date: 01/29/2022 Start Time: 1000 End Time: 1040 Facilitators: [name], LRT,CTRS Location: [address] Dayroom   Goal Area(s) Addresses:  Patient will effectively listen to complete activity.  Patient will identify communication skills used to make activity successful.  Patient will identify how skills used during activity can be used to reach post d/c goals.    Group Description:  Geometric Drawings.  Three volunteers from the peer group will be shown an abstract picture with a particular arrangement of geometrical shapes.  Each round, one 'speaker' will describe the pattern, as accurately as possible without revealing the image to the group.  The remaining group members will listen and draw the picture to reflect how it is described to them. Patients with the role of 'listener' cannot ask clarifying questions but, may request that the speaker repeat a direction. Once the drawings are complete, the presenter will show the rest of the group the picture and compare how close each person came to drawing the picture. LRT will facilitate a post-activity discussion regarding effective communication and the importance of planning, listening, and asking for clarification in daily interactions with others.   Affect/Mood: N/A   Participation Level: Did not attend    Clinical Observations/Individualized Feedback:     Plan: Continue to engage patient in RT group sessions 2-3x/week.   [name], LRT,CTRS 01/29/2022 1:00 PM

## 2022-01-29 NOTE — H&P (Signed)
Psychiatric Admission Assessment Adult  Patient Identification: Harry Jones MRN:  [id number] Date of Evaluation:  01/29/2022 Chief Complaint:  Schizo affective schizophrenia (HCC) [F25.9] Principal Diagnosis: Schizoaffective disorder (HCC) Diagnosis:  Principal Problem:   Schizoaffective disorder (HCC) Active Problems:   Homelessness   Suicidal ideation  Harry Jones is a 66 year old male with a self-reported past psychiatric history of schizophrenia, bipolar disorder, and depression.  There is very little information available about this patient in the chart previous to 2 days ago.  He was transferred from the behavioral health urgent care to the [facility] behavioral health hospital due to suicidal thoughts with a plan to walk into traffic.  He has also been reporting command auditory hallucinations.  He is admitted on a voluntary basis.  Mode of transport to Hospital: [facility] to [facility] initially Current Outpatient (Home) Medication List: Cogentin, eye drops, and Xarelto PRN medication prior to evaluation: NA  ED course: uneventful, ED provider (Dr. Cyndie Chime) noted concern for secondary gain Collateral Information: patient denies collateral contact POA/Legal Guardian: unknown  HPI:   Per chart review, the patient presented to the [facility] behavioral health urgent care on 7/4 via the [facility].  Per reports, a hotel manager called the police because the patient was reporting suicidal thoughts with a plan to walk into traffic.  At the behavioral health urgent care, the patient endorsed chronic command auditory hallucinations and visual hallucinations.  There is no evidence of internal preoccupation or responding to internal stimuli.  Dr. Cyndie Chime was concerned for secondary gain given that the patient would not engage with evaluation and was vague in his description of psychosis.  Of note, there is no documentation available for this patient before 7/4.  No significant documentation is  listed in care everywhere.  The patient is seen in the late morning at the [facility] behavioral health hospital on 7/6.  He is finishing up his conversation with the Child psychotherapist.  She asks him to sign some paperwork.  The patient appears concerned that he is being discharged and yells at the social worker "if you discharge me I will jump off a bridge".  He repeats this several times.  Interview with the patient after this is difficult.  He appears very suspicious and guarded and is unwilling to divulge autobiographical details.  At the same time I question if there is some cognitive impairment contributing to his inability to remember details from his life.  He is willing to state that he lives under a bridge somewhere with a group of people.  He states that he desires to get better in order to help this group of people.  Unable to perform psychiatric review of symptoms given the patient's unwillingness to engage in interview.  On approach later in the day, the patient is willing to start low-dose Seroquel for his reported hallucinations.  Past Psychiatric Hx: Unable to collect  Substance Abuse Hx: Alcohol: Unable to collect Tobacco: Unable to collect Illicit drugs: UDS positive for cocaine and THC  Past Medical History: Unable to collect  Family History: Unable to collect  Social History: Unable to collect   Total Time spent with patient: 30 minutes  Past Psychiatric History: Unable to collect  Is the patient at risk to self? Yes.    Has the patient been a risk to self in the past 6 months? Yes.    Has the patient been a risk to self within the distant past? Unable to collect Is the patient a risk to others?  No.  Has the patient been a risk to others in the past 6 months? No.  Has the patient been a risk to others within the distant past? No.   Prior Inpatient Therapy:   Prior Outpatient Therapy:    Alcohol Screening: 1. How often do you have a drink containing alcohol?: 4 or  more times a week 2. How many drinks containing alcohol do you have on a typical day when you are drinking?: 7, 8, or 9 3. How often do you have six or more drinks on one occasion?: Weekly AUDIT-C Score: 10 4. How often during the last year have you found that you were not able to stop drinking once you had started?: Weekly 5. How often during the last year have you failed to do what was normally expected from you because of drinking?: Less than monthly 6. How often during the last year have you needed a first drink in the morning to get yourself going after a heavy drinking session?: Less than monthly 7. How often during the last year have you had a feeling of guilt of remorse after drinking?: Weekly 8. How often during the last year have you been unable to remember what happened the night before because you had been drinking?: Less than monthly 9. Have you or someone else been injured as a result of your drinking?: No 10. Has a relative or friend or a doctor or another health worker been concerned about your drinking or suggested you cut down?: Yes, but not in the last year Alcohol Use Disorder Identification Test Final Score (AUDIT): 21 Alcohol Brief Interventions/Follow-up: Alcohol education/Brief advice Substance Abuse History in the last 12 months:  Yes.   Consequences of Substance Abuse: Unable to collect Previous Psychotropic Medications: Yes  Psychological Evaluations:  Unable to collect Past Medical History: No past medical history on file. No past surgical history on file. Family History: No family history on file. Family Psychiatric  History: Unable to collect Tobacco Screening:   Social History:  Social History   Substance and Sexual Activity  Alcohol Use None     Social History   Substance and Sexual Activity  Drug Use Not on file    Additional Social History: Marital status: Widowed Widowed, when?: 7 years ago Are you sexually active?: No What is your sexual  orientation?: patient declined to answer Has your sexual activity been affected by drugs, alcohol, medication, or emotional stress?: patient declined to answer Does patient have children?: No                         Allergies:  No Known Allergies Lab Results:  Results for orders placed or performed during the hospital encounter of 01/28/22 (from the past 48 hour(s))  RPR     Status: None   Collection Time: 01/29/22  6:31 AM  Result Value Ref Range   RPR Ser Ql NON REACTIVE NON REACTIVE    Comment: Performed at [facility], [address], [place], [place] [address]    Blood Alcohol level:  Lab Results  Component Value Date   ETH <10 01/27/2022    Metabolic Disorder Labs:  Lab Results  Component Value Date   HGBA1C 5.5 01/27/2022   MPG 111.15 01/27/2022   No results found for: "PROLACTIN" Lab Results  Component Value Date   CHOL 187 01/27/2022   TRIG 46 01/27/2022   HDL 79 01/27/2022   CHOLHDL 2.4 01/27/2022   VLDL  9 01/27/2022   LDLCALC 99 01/27/2022    Current Medications: Current Facility-Administered Medications  Medication Dose Route Frequency Provider Last Rate Last Admin   acetaminophen (TYLENOL) tablet 650 mg  650 mg Oral Q6H PRN Princess Bruins, DO       alum & mag hydroxide-simeth (MAALOX/MYLANTA) 200-200-20 MG/5ML suspension 30 mL  30 mL Oral Q4H PRN Princess Bruins, DO       benztropine (COGENTIN) tablet 0.5 mg  0.5 mg Oral Daily Princess Bruins, DO   0.5 mg at 01/29/22 0818   benztropine (COGENTIN) tablet 1 mg  1 mg Oral BID PRN Carlyn Reichert, MD       Or   benztropine mesylate (COGENTIN) injection 2 mg  2 mg Intramuscular BID PRN Carlyn Reichert, MD       dorzolamide-timolol (COSOPT) 22.3-6.8 MG/ML ophthalmic solution 1 drop  1 drop Both Eyes BID Princess Bruins, DO   1 drop at 01/29/22 1287   haloperidol (HALDOL) tablet 5 mg  5 mg Oral Q8H PRN Carlyn Reichert, MD       Or   haloperidol lactate (HALDOL) injection 5 mg  5 mg Intramuscular Q8H  PRN Carlyn Reichert, MD       hydrOXYzine (ATARAX) tablet 25 mg  25 mg Oral Q6H PRN Princess Bruins, DO   25 mg at 01/29/22 8676   loperamide (IMODIUM) capsule 2-4 mg  2-4 mg Oral PRN Princess Bruins, DO       LORazepam (ATIVAN) tablet 1 mg  1 mg Oral Q6H PRN Princess Bruins, DO       magnesium hydroxide (MILK OF MAGNESIA) suspension 30 mL  30 mL Oral Daily PRN Princess Bruins, DO       melatonin tablet 3 mg  3 mg Oral QHS Princess Bruins, DO   3 mg at 01/28/22 2058   multivitamin with minerals tablet 1 tablet  1 tablet Oral Daily Princess Bruins, DO   1 tablet at 01/29/22 0818   nicotine polacrilex (NICORETTE) gum 2 mg  2 mg Oral PRN Princess Bruins, DO       ondansetron (ZOFRAN-ODT) disintegrating tablet 4 mg  4 mg Oral Q6H PRN Princess Bruins, DO       QUEtiapine (SEROQUEL) tablet 25 mg  25 mg Oral Daily Carlyn Reichert, MD       QUEtiapine (SEROQUEL) tablet 25 mg  25 mg Oral QHS Carlyn Reichert, MD       rivaroxaban Carlena Hurl) tablet 20 mg  20 mg Oral Daily Princess Bruins, DO   20 mg at 01/29/22 0818   thiamine tablet 100 mg  100 mg Oral Daily Princess Bruins, DO   100 mg at 01/29/22 0818   PTA Medications: Medications Prior to Admission  Medication Sig Dispense Refill Last Dose   benztropine (COGENTIN) 0.5 MG tablet Take 0.5 mg by mouth daily. (Patient not taking: Reported on 01/28/2022)      dorzolamide-timolol (COSOPT) 22.3-6.8 MG/ML ophthalmic solution Place 1 drop into both eyes 2 (two) times daily.      melatonin 3 MG TABS tablet Take 1 tablet (3 mg total) by mouth at bedtime.  0    Multiple Vitamin (MULTIVITAMIN WITH MINERALS) TABS tablet Take 1 tablet by mouth daily.      nicotine polacrilex (NICORETTE) 2 MG gum Take 1 each (2 mg total) by mouth as needed for smoking cessation. 100 tablet 0    thiamine 100 MG tablet Take 1 tablet (100 mg total) by mouth daily.      Carlena Hurl  20 MG TABS tablet Take 20 mg by mouth daily.       Musculoskeletal: Strength & Muscle Tone: within normal limits Gait &  Station: normal Patient leans: N/A   Psychiatric Specialty Exam:  Presentation  General Appearance: Disheveled  Eye Contact:Poor  Speech:Clear and Coherent  Speech Volume:Decreased  Handedness:Right   Mood and Affect  Mood:Labile  Affect:Labile   Thought Process  Thought Processes:Linear  Duration of Psychotic Symptoms: -- (unknown)  Past Diagnosis of Schizophrenia or Psychoactive disorder: -- (unknown)  Descriptions of Associations:Circumstantial  Orientation:Full (Time, Place and Person)  Thought Content:Perseveration; Paranoid Ideation  Hallucinations patient reports unspecified AH Ideas of Reference:None  Suicidal Thoughts:Suicidal Thoughts: Yes, Active SI Active Intent and/or Plan: With Plan  Homicidal Thoughts:Homicidal Thoughts: No   Sensorium  Memory:Immediate Poor; Recent Poor; Remote Poor  Judgment:Poor  Insight:Poor   Executive Functions  Concentration:Poor  Attention Span:Poor  Recall:Poor  Fund of Knowledge:Poor  Language:Poor   Psychomotor Activity  Psychomotor Activity:Psychomotor Activity: Normal   Assets  Assets:Communication Skills; Desire for Improvement; Resilience; Leisure Time   Sleep  Sleep:Sleep: Fair    Physical Exam: Physical Exam Constitutional:      Appearance: the patient is not toxic-appearing.  Pulmonary:     Effort: Pulmonary effort is normal.  Neurological:     General: No focal deficit present.     Mental Status: the patient is alert and oriented to person, place, and time.   Review of Systems  Respiratory:  Negative for shortness of breath.   Cardiovascular:  Negative for chest pain.  Gastrointestinal:  Negative for abdominal pain, constipation, diarrhea, nausea and vomiting.  Neurological:  Negative for headaches.   Blood pressure 121/79, pulse 73, temperature 98.3 F (36.8 C), temperature source Oral, resp. rate 20, height  (1.956 m), weight 102.1 kg, SpO2 94 %. Body mass index is  26.68 kg/m.  Treatment Plan Summary: Daily contact with patient to assess and evaluate symptoms and progress in treatment and Medication management  Physician Treatment Plan for Primary Diagnosis: Schizoaffective disorder (HCC) Long Term Goal(s): Improvement in symptoms so as ready for discharge  Short Term Goals: Ability to identify changes in lifestyle to reduce recurrence of condition will improve and Ability to verbalize feelings will improve   ASSESSMENT:  Diagnoses / Active Problems: Limited information for diagnosis at this time Schizoaffective disorder, unspecified type R/o Stimulant (cocaine type) and cannabis use disorders R/o malingering of psychotic and depressive symptoms  PLAN: Safety and Monitoring:  --  Voluntary admission to inpatient psychiatric unit for safety, stabilization and treatment  -- Daily contact with patient to assess and evaluate symptoms and progress in treatment  -- Patient's case to be discussed in multi-disciplinary team meeting  -- Observation Level : q15 minute checks  -- Vital signs:  q12 hours  -- Precautions: suicide, elopement, and assault  2. Psychiatric Diagnoses and Treatment:  Schizoaffective disorder, unspecified type  -- Start Seroquel 25 mg daily and 25 mg QHS for psychosis  -- Continue home Cogentin 0.5 mg daily -- The risks/benefits/side-effects/alternatives to this medication were discussed in detail with the patient and time was given for questions. The patient consents to medication trial.   -- Metabolic profile and EKG monitoring obtained while on an atypical antipsychotic (BMI: 27 Lipid Panel: WNL HbgA1c: WNL QTc: 442)   -- Encouraged patient to participate in unit milieu and in scheduled group therapies   --Agitation protocol with Haldol 5 mg PO/IM and Cogentin PO/IM  -- Short Term Goals: Ability  to identify changes in lifestyle to reduce recurrence of condition will improve and Ability to verbalize feelings will  improve  -- Long Term Goals: Improvement in symptoms so as ready for discharge    3. Medical Issues Being Addressed:   Tobacco Use Disorder  -- Nicotine patch 21mg /24 hours ordered  -- Smoking cessation encouraged  VTE or other coagulopathy  -- Continue home Xarelto  4. Discharge Planning:   -- Social work and case management to assist with discharge planning and identification of hospital follow-up needs prior to discharge  -- Estimated LOS: 5-7 days  -- Discharge Concerns: Need to establish a safety plan; Medication compliance and effectiveness  -- Discharge Goals: Return home with outpatient referrals for mental health follow-up including medication management/psychotherapy  I certify that inpatient services furnished can reasonably be expected to improve the patient's condition.    01-13-1983, MD 7/6/20231:12 PM

## 2022-01-29 NOTE — Progress Notes (Signed)
Pt denies HI but endorses SI and verbally agrees to approach staff if these become apparent or before harming themselves/others. Pt would not answer verbally if he was having AVH, pt somewhat just shook his yes. Rates depression 10/10. Rates anxiety 10/10. Rates pain 0/10.  Pt is extremely paranoid. Pt repeated multiple times that he did not trust people. Pt was standing far from the medication, would look at RN seldom. Pt may have a slight liking and or more trust to his skin color. Another RN walked into med room, he looked at her, got wide-eyed and walked away. Pt has been isolating in his room all day. Pt did not go down to dinner and stated that "there is a bunch of nasty people in there." Took meds for other RN. Scheduled medications administered to pt, per MD orders. RN provided support and encouragement to pt. Q15 min safety checks implemented and continued. Pt safe on the unit. RN will continue to monitor and intervene as needed.   01/29/22 0818  Psych Admission Type (Psych Patients Only)  Admission Status Voluntary  Psychosocial Assessment  Patient Complaints Anxiety;Irritability;Sleep disturbance;Depression  Eye Contact Avoids;Poor  Facial Expression Anxious;Worried  Affect Anxious;Depressed;Irritable  Solicitor Cautious;Guarded  Motor Activity Slow  Appearance/Hygiene Disheveled  Behavior Characteristics Agitated;Anxious;Guarded;Irritable  Mood Suspicious;Anxious;Depressed;Irritable  Thought Process  Coherency Circumstantial  Content Paranoia;Blaming others  Delusions Paranoid  Perception Hallucinations  Hallucination Auditory;Visual  Judgment Poor  Confusion None  Danger to Self  Current suicidal ideation? Denies  Danger to Others  Danger to Others None reported or observed

## 2022-01-30 ENCOUNTER — Encounter (HOSPITAL_COMMUNITY): Payer: Self-pay

## 2022-01-30 MED ORDER — QUETIAPINE FUMARATE 50 MG PO TABS
50.0000 mg | ORAL_TABLET | Freq: Every day | ORAL | Status: DC
Start: 2022-01-30 — End: 2022-01-31
  Administered 2022-01-30: 50 mg via ORAL
  Filled 2022-01-30 (×2): qty 1

## 2022-01-30 NOTE — Progress Notes (Signed)
[facility] MD Progress Note  01/30/2022 4:13 PM VI WHITESEL  MRN:  [id number] Subjective:    Harry Jones is a 66 year old male with a self-reported past psychiatric history of schizophrenia, bipolar disorder, and depression.  There is very little information available about this patient in the chart previous to 2 days ago.  He was transferred from the behavioral health urgent care to the [facility] behavioral health hospital due to suicidal thoughts with a plan to walk into traffic.  He has also been reporting command auditory hallucinations.  He is admitted on a voluntary basis.  Yesterday, the following recommendations were made:              -- Start Seroquel 25 mg daily and 25 mg QHS for psychosis             -- Continue home Cogentin 0.5 mg daily  Interval History: PRN Medications administered within the last 24 hours: Vistaril x2 Per nursing staff: anxious, cautious, and guarded   Per Patient:  On assessment today, the patient reports a mood that is "in between".  The patient reports sleeping "off and on".  The patient reports visual hallucinations of shadows and "bad" command auditory hallucinations that say "you all are lying to me".  He states that he is trying to distance himself from his command auditory hallucinations.  He still feels that people are "messing with me" by asking him if he is hearing voices.  Patient denies suicidal thoughts or homicidal thoughts.  He is amenable to increasing his Seroquel for his psychotic symptoms.  Patient denies side effects to current scheduled psychiatric medications.   Patient denies other somatic complaints.   Principal Problem: Schizoaffective disorder (HCC) Diagnosis: Principal Problem:   Schizoaffective disorder (HCC) Active Problems:   Homelessness   Suicidal ideation  Total Time spent with patient: 15 minutes  Past Psychiatric History: unable to collect  Past Medical History: No past medical history on file. No past surgical history on  file. Family History: No family history on file. Family Psychiatric  History: unable to collect Social History:  Social History   Substance and Sexual Activity  Alcohol Use None     Social History   Substance and Sexual Activity  Drug Use Not on file    Social History   Socioeconomic History   Marital status: Widowed    Spouse name: Not on file   Number of children: Not on file   Years of education: Not on file   Highest education level: Not on file  Occupational History   Not on file  Tobacco Use   Smoking status: Every Day    Packs/day: 1.00    Types: Cigarettes   Smokeless tobacco: Never  Substance and Sexual Activity   Alcohol use: Not on file   Drug use: Not on file   Sexual activity: Not on file  Other Topics Concern   Not on file  Social History Narrative   Per chart review & Nursing report: Pt is homeless lives under a bridge. Wife died 7 years ago.   Social Determinants of Health   Financial Resource Strain: Not on file  Food Insecurity: Not on file  Transportation Needs: Not on file  Physical Activity: Not on file  Stress: Not on file  Social Connections: Not on file   Additional Social History:       Sleep: Fair  Appetite:  Fair  Current Medications: Current Facility-Administered Medications  Medication Dose Route Frequency Provider Last Rate  Last Admin   acetaminophen (TYLENOL) tablet 650 mg  650 mg Oral Q6H PRN Princess Bruins, DO       alum & mag hydroxide-simeth (MAALOX/MYLANTA) 200-200-20 MG/5ML suspension 30 mL  30 mL Oral Q4H PRN Princess Bruins, DO       benztropine (COGENTIN) tablet 0.5 mg  0.5 mg Oral Daily Princess Bruins, DO   0.5 mg at 01/30/22 0820   benztropine (COGENTIN) tablet 1 mg  1 mg Oral BID PRN Carlyn Reichert, MD       Or   benztropine mesylate (COGENTIN) injection 2 mg  2 mg Intramuscular BID PRN Carlyn Reichert, MD       dorzolamide-timolol (COSOPT) 22.3-6.8 MG/ML ophthalmic solution 1 drop  1 drop Both Eyes BID Princess Bruins, DO   1 drop at 01/30/22 0820   haloperidol (HALDOL) tablet 5 mg  5 mg Oral Q8H PRN Carlyn Reichert, MD       Or   haloperidol lactate (HALDOL) injection 5 mg  5 mg Intramuscular Q8H PRN Carlyn Reichert, MD       hydrOXYzine (ATARAX) tablet 25 mg  25 mg Oral Q6H PRN Princess Bruins, DO   25 mg at 01/29/22 2049   loperamide (IMODIUM) capsule 2-4 mg  2-4 mg Oral PRN Princess Bruins, DO       LORazepam (ATIVAN) tablet 1 mg  1 mg Oral Q6H PRN Princess Bruins, DO       magnesium hydroxide (MILK OF MAGNESIA) suspension 30 mL  30 mL Oral Daily PRN Princess Bruins, DO       melatonin tablet 3 mg  3 mg Oral QHS Princess Bruins, DO   3 mg at 01/29/22 2049   multivitamin with minerals tablet 1 tablet  1 tablet Oral Daily Princess Bruins, DO   1 tablet at 01/30/22 0820   nicotine polacrilex (NICORETTE) gum 2 mg  2 mg Oral PRN Princess Bruins, DO       ondansetron (ZOFRAN-ODT) disintegrating tablet 4 mg  4 mg Oral Q6H PRN Princess Bruins, DO       QUEtiapine (SEROQUEL) tablet 25 mg  25 mg Oral Daily Carlyn Reichert, MD   25 mg at 01/30/22 9629   QUEtiapine (SEROQUEL) tablet 50 mg  50 mg Oral QHS Carlyn Reichert, MD       rivaroxaban Carlena Hurl) tablet 20 mg  20 mg Oral Daily Princess Bruins, DO   20 mg at 01/30/22 0820   thiamine tablet 100 mg  100 mg Oral Daily Princess Bruins, DO   100 mg at 01/30/22 5284    Lab Results:  Results for orders placed or performed during the hospital encounter of 01/28/22 (from the past 48 hour(s))  RPR     Status: None   Collection Time: 01/29/22  6:31 AM  Result Value Ref Range   RPR Ser Ql NON REACTIVE NON REACTIVE    Comment: Performed at [facility], [address], [place], [place] [address]    Blood Alcohol level:  Lab Results  Component Value Date   ETH <10 01/27/2022    Metabolic Disorder Labs: Lab Results  Component Value Date   HGBA1C 5.5 01/27/2022   MPG 111.15 01/27/2022   No results found for: "PROLACTIN" Lab Results  Component Value Date   CHOL 187  01/27/2022   TRIG 46 01/27/2022   HDL 79 01/27/2022   CHOLHDL 2.4 01/27/2022   VLDL 9 01/27/2022   LDLCALC 99 01/27/2022    Physical Findings: AIMS: Facial and  Oral Movements Muscles of Facial Expression: None, normal Lips and Perioral Area: None, normal Jaw: None, normal Tongue: None, normal,Extremity Movements Upper (arms, wrists, hands, fingers): None, normal Lower (legs, knees, ankles, toes): None, normal, Trunk Movements Neck, shoulders, hips: None, normal, Overall Severity Severity of abnormal movements (highest score from questions above): None, normal Incapacitation due to abnormal movements: None, normal Patient's awareness of abnormal movements (rate only patient's report): No Awareness, Dental Status Current problems with teeth and/or dentures?: No Does patient usually wear dentures?: No  CIWA:  CIWA-Ar Total: 0 COWS:     Musculoskeletal: Strength & Muscle Tone: within normal limits Gait & Station: normal Patient leans: N/A  Psychiatric Specialty Exam:  Presentation  General Appearance: Disheveled  Eye Contact:Poor  Speech:Clear and Coherent  Speech Volume:Decreased  Handedness:Right   Mood and Affect  Mood:-- ("in between")  Affect:Constricted   Thought Process  Thought Processes:Coherent  Descriptions of Associations:Intact  Orientation:Full (Time, Place and Person)  Thought Content:Logical  History of Schizophrenia/Schizoaffective disorder:-- (unknown)  Duration of Psychotic Symptoms:-- (unknown)  Hallucinations:Hallucinations: Command; Auditory; Visual Description of Command Hallucinations: "y'all are lying to me" Description of Visual Hallucinations: shadows  Ideas of Reference:None  Suicidal Thoughts:Suicidal Thoughts: No SI Active Intent and/or Plan: With Plan  Homicidal Thoughts:Homicidal Thoughts: No   Sensorium  Memory:Immediate Poor; Recent Poor; Remote Poor  Judgment:Poor  Insight:Poor   Executive Functions   Concentration:Poor  Attention Span:Poor  Recall:Poor  Fund of Knowledge:Poor  Language:Poor   Psychomotor Activity  Psychomotor Activity:Psychomotor Activity: Normal   Assets  Assets:Communication Skills; Desire for Improvement; Resilience; Leisure Time   Sleep  Sleep:Sleep: Fair    Physical Exam: Physical Exam Constitutional:      Appearance: the patient is not toxic-appearing.  Pulmonary:     Effort: Pulmonary effort is normal.  Neurological:     General: No focal deficit present.     Mental Status: the patient is alert and oriented to person, place, and time.   Review of Systems  Respiratory:  Negative for shortness of breath.   Cardiovascular:  Negative for chest pain.  Gastrointestinal:  Negative for abdominal pain, constipation, diarrhea, nausea and vomiting.  Neurological:  Negative for headaches.   Blood pressure 117/85, pulse 80, temperature 98 F (36.7 C), resp. rate 17, height 6\' 5"  (1.956 m), weight 102.1 kg, SpO2 100 %. Body mass index is 26.68 kg/m.   Treatment Plan Summary: Daily contact with patient to assess and evaluate symptoms and progress in treatment and Medication management  ASSESSMENT:   Diagnoses / Active Problems: Limited information for diagnosis at this time Schizoaffective disorder, unspecified type R/o Stimulant (cocaine type) and cannabis use disorders R/o malingering of psychotic and depressive symptoms   PLAN: Safety and Monitoring:             --  Voluntary admission to inpatient psychiatric unit for safety, stabilization and treatment             -- Daily contact with patient to assess and evaluate symptoms and progress in treatment             -- Patient's case to be discussed in multi-disciplinary team meeting             -- Observation Level : q15 minute checks             -- Vital signs:  q12 hours             -- Precautions: suicide, elopement, and assault   2. Psychiatric  Diagnoses and Treatment:   Schizoaffective disorder, unspecified type             -- Continue Seroquel 25 mg daily and increase to 50 mg QHS for psychosis             -- Continue home Cogentin 0.5 mg daily -- The risks/benefits/side-effects/alternatives to this medication were discussed in detail with the patient and time was given for questions. The patient consents to medication trial.              -- Metabolic profile and EKG monitoring obtained while on an atypical antipsychotic (BMI: 27 Lipid Panel: WNL HbgA1c: WNL QTc: 442)              -- Encouraged patient to participate in unit milieu and in scheduled group therapies              --Agitation protocol with Haldol 5 mg PO/IM and Cogentin PO/IM             -- Short Term Goals: Ability to identify changes in lifestyle to reduce recurrence of condition will improve and Ability to verbalize feelings will improve             -- Long Term Goals: Improvement in symptoms so as ready for discharge                3. Medical Issues Being Addressed:              Tobacco Use Disorder             -- Nicotine patch 21mg /24 hours ordered             -- Smoking cessation encouraged             VTE or other coagulopathy             -- Continue home Xarelto   4. Discharge Planning:              -- Social work and case management to assist with discharge planning and identification of hospital follow-up needs prior to discharge             -- Estimated LOS: 5-7 days             -- Discharge Concerns: Need to establish a safety plan; Medication compliance and effectiveness             -- Discharge Goals: Return home with outpatient referrals for mental health follow-up including medication management/psychotherapy   01-13-1983, MD 01/30/2022, 4:13 PM

## 2022-01-30 NOTE — Progress Notes (Signed)
   01/30/22 1000  Psych Admission Type (Psych Patients Only)  Admission Status Voluntary  Psychosocial Assessment  Patient Complaints Anxiety;Depression;Suspiciousness;Irritability  Eye Contact Avoids;Poor  Facial Expression Anxious;Worried  Affect Anxious;Depressed  Freight forwarder Activity Slow  Appearance/Hygiene Disheveled  Behavior Characteristics Anxious  Mood Anxious  Aggressive Behavior  Effect No apparent injury  Thought Process  Coherency Circumstantial  Content Paranoia  Delusions Paranoid  Perception Hallucinations  Hallucination Auditory;Visual  Judgment Impaired  Confusion None  Danger to Self  Current suicidal ideation? Denies  Danger to Others  Danger to Others None reported or observed

## 2022-01-30 NOTE — Progress Notes (Signed)
   01/30/22 0500  Sleep  Number of Hours 8.5

## 2022-01-30 NOTE — Progress Notes (Signed)
   01/30/22 2000  Psych Admission Type (Psych Patients Only)  Admission Status Voluntary  Psychosocial Assessment  Patient Complaints Anxiety;Depression  Eye Contact Fair  Facial Expression Anxious  Affect Appropriate to circumstance  Speech Logical/coherent  Interaction Cautious  Motor Activity Slow  Appearance/Hygiene Disheveled;Body odor;Layered clothes  Behavior Characteristics Cooperative  Mood Anxious;Depressed  Aggressive Behavior  Effect No apparent injury  Thought Process  Coherency Circumstantial  Content Blaming others  Delusions Paranoid  Perception Hallucinations  Hallucination Auditory;Visual  Judgment Impaired  Confusion None  Danger to Self  Current suicidal ideation? Denies  Danger to Others  Danger to Others None reported or observed

## 2022-01-30 NOTE — Progress Notes (Signed)
Recreation Therapy Notes  INPATIENT RECREATION THERAPY ASSESSMENT  Patient Details Name: Harry Jones MRN: [id number] DOB: 1955/12/28 Today's Date: 01/30/2022       Information Obtained From: Patient  Able to Participate in Assessment/Interview: Yes  Patient Presentation: Resistant  Reason for Admission (Per Patient): Other (Comments) (Patient did not answer)  Patient Stressors: Other (Comment) (None identified)  Coping Skills:    ("nothing")  Leisure Interests (2+):   ("I don't like nothing")  Awareness of Community Resources:   (Did not answer)  Expressed Interest in [employer] Information:  (Did not answer)  [place] of Residence:  [place]  Patient Strengths:  "getting better"  Patient Identified Areas of Improvement:  "No"  Patient Goal for Hospitalization:  "getting better"  Current SI (including self-harm):   ("the more people ask the more the thoughts come")  Current HI:   (did not answer)  Current AVH:  (did not answer)  Staff Intervention Plan: Group Attendance, Collaborate with Interdisciplinary Treatment Team  Consent to Intern Participation: N/A   [name], Richardean Sale, Kameren Pargas A 01/30/2022, 1:17 PM

## 2022-01-30 NOTE — Group Note (Signed)
[facility] LCSW Group Therapy Note   Group Date: 01/30/2022 Start Time: 1300 End Time: 1400   Type of Therapy/Topic:  Group Therapy:  Emotion Regulation  Participation Level:  Did Not Attend     Description of Group:    The purpose of this group is to assist patients in learning to regulate negative emotions and experience positive emotions. Patients will be guided to discuss ways in which they have been vulnerable to their negative emotions. These vulnerabilities will be juxtaposed with experiences of positive emotions or situations, and patients challenged to use positive emotions to combat negative ones. Special emphasis will be placed on coping with negative emotions in conflict situations, and patients will process healthy conflict resolution skills.  Therapeutic Goals: Patient will identify two positive emotions or experiences to reflect on in order to balance out negative emotions:  Patient will label two or more emotions that they find the most difficult to experience:  Patient will be able to demonstrate positive conflict resolution skills through discussion or role plays:   Summary of Patient Progress:   Did not attend    Therapeutic Modalities:   Cognitive Behavioral Therapy Feelings Identification Dialectical Behavioral Therapy   Jaxson Keener E Raegan Sipp, LCSW 

## 2022-01-30 NOTE — BH IP Treatment Plan (Signed)
Interdisciplinary Treatment and Diagnostic Plan Update  01/30/2022 Time of Session: 10:50am Harry Jones MRN: [id number]  Principal Diagnosis: Schizoaffective disorder [facility])  Secondary Diagnoses: Principal Problem:   Schizoaffective disorder ([place]) Active Problems:   Homelessness   Suicidal ideation   Current Medications:  Current Facility-Administered Medications  Medication Dose Route Frequency Provider Last Rate Last Admin   acetaminophen (TYLENOL) tablet 650 mg  650 mg Oral Q6H PRN Merrily Brittle, DO       alum & mag hydroxide-simeth (MAALOX/MYLANTA) 200-200-20 MG/5ML suspension 30 mL  30 mL Oral Q4H PRN Merrily Brittle, DO       benztropine (COGENTIN) tablet 0.5 mg  0.5 mg Oral Daily Merrily Brittle, DO   0.5 mg at 01/30/22 0820   benztropine (COGENTIN) tablet 1 mg  1 mg Oral BID PRN Corky Sox, MD       Or   benztropine mesylate (COGENTIN) injection 2 mg  2 mg Intramuscular BID PRN Corky Sox, MD       dorzolamide-timolol (COSOPT) 22.3-6.8 MG/ML ophthalmic solution 1 drop  1 drop Both Eyes BID Merrily Brittle, DO   1 drop at 01/30/22 0820   haloperidol (HALDOL) tablet 5 mg  5 mg Oral Q8H PRN Corky Sox, MD       Or   haloperidol lactate (HALDOL) injection 5 mg  5 mg Intramuscular Q8H PRN Corky Sox, MD       hydrOXYzine (ATARAX) tablet 25 mg  25 mg Oral Q6H PRN Merrily Brittle, DO   25 mg at 01/29/22 2049   loperamide (IMODIUM) capsule 2-4 mg  2-4 mg Oral PRN Merrily Brittle, DO       LORazepam (ATIVAN) tablet 1 mg  1 mg Oral Q6H PRN Merrily Brittle, DO       magnesium hydroxide (MILK OF MAGNESIA) suspension 30 mL  30 mL Oral Daily PRN Merrily Brittle, DO       melatonin tablet 3 mg  3 mg Oral QHS Merrily Brittle, DO   3 mg at 01/29/22 2049   multivitamin with minerals tablet 1 tablet  1 tablet Oral Daily Merrily Brittle, DO   1 tablet at 01/30/22 0820   nicotine polacrilex (NICORETTE) gum 2 mg  2 mg Oral PRN Merrily Brittle, DO       ondansetron (ZOFRAN-ODT) disintegrating  tablet 4 mg  4 mg Oral Q6H PRN Merrily Brittle, DO       QUEtiapine (SEROQUEL) tablet 25 mg  25 mg Oral Daily Corky Sox, MD   25 mg at 01/30/22 0929   QUEtiapine (SEROQUEL) tablet 50 mg  50 mg Oral QHS Corky Sox, MD       rivaroxaban Alveda Reasons) tablet 20 mg  20 mg Oral Daily Merrily Brittle, DO   20 mg at 01/30/22 0820   thiamine tablet 100 mg  100 mg Oral Daily Merrily Brittle, DO   100 mg at 01/30/22 0820   PTA Medications: Medications Prior to Admission  Medication Sig Dispense Refill Last Dose   benztropine (COGENTIN) 0.5 MG tablet Take 0.5 mg by mouth daily. (Patient not taking: Reported on 01/28/2022)      dorzolamide-timolol (COSOPT) 22.3-6.8 MG/ML ophthalmic solution Place 1 drop into both eyes 2 (two) times daily.      melatonin 3 MG TABS tablet Take 1 tablet (3 mg total) by mouth at bedtime.  0    Multiple Vitamin (MULTIVITAMIN WITH MINERALS) TABS tablet Take 1 tablet by mouth daily.      nicotine polacrilex (NICORETTE) 2 MG gum  Take 1 each (2 mg total) by mouth as needed for smoking cessation. 100 tablet 0    thiamine 100 MG tablet Take 1 tablet (100 mg total) by mouth daily.      XARELTO 20 MG TABS tablet Take 20 mg by mouth daily.       Patient Stressors: Financial difficulties   Loss of significant relationship "wife died 7 years ago"   Substance abuse    Patient Strengths: Capable of independent living  Scientist, research (life sciences)  Religious Affiliation  Supportive family/friends   Treatment Modalities: Medication Management, Group therapy, Case management,  1 to 1 session with clinician, Psychoeducation, Recreational therapy.   Physician Treatment Plan for Primary Diagnosis: Schizoaffective disorder ([place]) Long Term Goal(s): Improvement in symptoms so as ready for discharge   Short Term Goals: Ability to identify changes in lifestyle to reduce recurrence of condition will improve Ability to verbalize feelings will improve  Medication Management: Evaluate patient's response,  side effects, and tolerance of medication regimen.  Therapeutic Interventions: 1 to 1 sessions, Unit Group sessions and Medication administration.  Evaluation of Outcomes: Not Met  Physician Treatment Plan for Secondary Diagnosis: Principal Problem:   Schizoaffective disorder ([place]) Active Problems:   Homelessness   Suicidal ideation  Long Term Goal(s): Improvement in symptoms so as ready for discharge   Short Term Goals: Ability to identify changes in lifestyle to reduce recurrence of condition will improve Ability to verbalize feelings will improve     Medication Management: Evaluate patient's response, side effects, and tolerance of medication regimen.  Therapeutic Interventions: 1 to 1 sessions, Unit Group sessions and Medication administration.  Evaluation of Outcomes: Not Met   RN Treatment Plan for Primary Diagnosis: Schizoaffective disorder ([place]) Long Term Goal(s): Knowledge of disease and therapeutic regimen to maintain health will improve  Short Term Goals: Ability to remain free from injury will improve, Ability to verbalize frustration and anger appropriately will improve, Ability to demonstrate self-control, Ability to participate in decision making will improve, Ability to verbalize feelings will improve, Ability to disclose and discuss suicidal ideas, Ability to identify and develop effective coping behaviors will improve, and Compliance with prescribed medications will improve  Medication Management: RN will administer medications as ordered by provider, will assess and evaluate patient's response and provide education to patient for prescribed medication. RN will report any adverse and/or side effects to prescribing provider.  Therapeutic Interventions: 1 on 1 counseling sessions, Psychoeducation, Medication administration, Evaluate responses to treatment, Monitor vital signs and CBGs as ordered, Perform/monitor CIWA, COWS, AIMS and Fall Risk screenings as ordered,  Perform wound care treatments as ordered.  Evaluation of Outcomes: Not Met   LCSW Treatment Plan for Primary Diagnosis: Schizoaffective disorder ([place]) Long Term Goal(s): Safe transition to appropriate next level of care at discharge, Engage patient in therapeutic group addressing interpersonal concerns.  Short Term Goals: Engage patient in aftercare planning with referrals and resources, Increase social support, Increase ability to appropriately verbalize feelings, Increase emotional regulation, Facilitate acceptance of mental health diagnosis and concerns, Facilitate patient progression through stages of change regarding substance use diagnoses and concerns, Identify triggers associated with mental health/substance abuse issues, and Increase skills for wellness and recovery  Therapeutic Interventions: Assess for all discharge needs, 1 to 1 time with Social worker, Explore available resources and support systems, Assess for adequacy in community support network, Educate family and significant other(s) on suicide prevention, Complete Psychosocial Assessment, Interpersonal group therapy.  Evaluation of Outcomes: Not Met   Progress in Treatment: Attending  groups: No. Participating in groups: No. Taking medication as prescribed: Yes. Toleration medication: Yes. Family/Significant other contact made: No, will contact:  patient has declined consents Patient understands diagnosis: Yes. Discussing patient identified problems/goals with staff: Yes. Medical problems stabilized or resolved: Yes. Denies suicidal/homicidal ideation: Yes. Issues/concerns per patient self-inventory: No.  New problem(s) identified: No, Describe:  none reported  New Short Term/Long Term Goal(s): detox, medication management for mood stabilization; elimination of SI thoughts; development of comprehensive mental wellness/sobriety plan     Patient Goals:  Patient states, "I want to stop hearing voices"  Discharge  Plan or Barriers: Patient recently admitted. CSW will continue to follow and assess for appropriate referrals and possible discharge planning.    Reason for Continuation of Hospitalization: Depression Hallucinations Suicidal ideation Withdrawal symptoms  Estimated Length of Stay: 5-7 days  Last 3 [place] Suicide Severity Risk Score: [place] Admission (Current) from 01/28/2022 in [place] [facility] ED from 01/27/2022 in [place] CATEGORY High Risk High Risk       Last [facility] 2/9 Scores:     No data to display          Scribe for Treatment Team: Zachery Conch, LCSW 01/30/2022 1:31 PM

## 2022-01-30 NOTE — Group Note (Signed)
Recreation Therapy Group Note   Group Topic:Team Building  Group Date: 01/30/2022 Start Time: 1002 End Time: 1033 Facilitators: [name], LRT,CTRS Location: [address] Dayroom   Goal Area(s) Addresses:  Patient will effectively work with peer towards shared goal.  Patient will identify skill used to make activity successful.  Patient will identify how skills used during activity can be used to reach post d/c goals.   Group Description:  Patient(s) were given a set of solo cups, a rubber band, and some tied strings. The objective is to build a pyramid with the cups by only using the rubber band and string to move the cups. After the activity the patient(s) and LRT debriefed and discussed what strategies worked, what didn't, and what lessons they can take from the activity and use in life post discharge.    Affect/Mood: Flat   Participation Level: None   Participation Quality: None   Behavior: Withdrawn   Speech/Thought Process: N/A   Insight: N/A   Judgement: N/A   Modes of Intervention: Team-building   Patient Response to Interventions:  Disengaged   Education Outcome:  Acknowledges education and In group clarification offered    Clinical Observations/Individualized Feedback: Pt sat off to himself.  Pt didn't want to participate because he expressed he was claustrophobic.    Plan: Continue to engage patient in RT group sessions 2-3x/week.   [name], LRT,CTRS 01/30/2022 1:00 PM

## 2022-01-30 NOTE — Progress Notes (Addendum)
SPIRITUALITY GROUP NOTE  Spirituality group facilitated by Wilkie Aye, MDiv, BCC.  Group Description:  Group focused on topic of hope.  Patients participated in facilitated discussion around topic, connecting with one another around experiences and definitions for hope.  Group members engaged with visual explorer photos, reflecting on what hope looks like for them today.  Group engaged in discussion around how their definitions of hope are present today in hospital.   Modalities: Psycho-social ed, Adlerian, Narrative, MI Patient Progress: Present at beginning of group.  Harry Jones gave his name during group introduction.  He did not engage further in discussion.  Left group and did not return

## 2022-01-31 ENCOUNTER — Encounter (HOSPITAL_COMMUNITY): Payer: Self-pay | Admitting: Student

## 2022-01-31 MED ORDER — RISPERIDONE 1 MG PO TABS
1.0000 mg | ORAL_TABLET | Freq: Every day | ORAL | Status: DC
  Administered 2022-01-31 – 2022-02-03 (×4): 1 mg via ORAL
  Filled 2022-01-31 (×5): qty 1

## 2022-01-31 MED ORDER — NICOTINE 21 MG/24HR TD PT24
21.0000 mg | MEDICATED_PATCH | Freq: Every day | TRANSDERMAL | Status: DC
  Administered 2022-01-31 – 2022-02-05 (×6): 21 mg via TRANSDERMAL
  Filled 2022-01-31 (×7): qty 1

## 2022-01-31 MED ORDER — RISPERIDONE 0.5 MG PO TABS
0.5000 mg | ORAL_TABLET | Freq: Every day | ORAL | Status: DC
  Administered 2022-02-01 – 2022-02-02 (×2): 0.5 mg via ORAL
  Filled 2022-01-31 (×4): qty 1

## 2022-01-31 MED ORDER — HYDROXYZINE HCL 25 MG PO TABS
25.0000 mg | ORAL_TABLET | Freq: Three times a day (TID) | ORAL | Status: DC | PRN
  Administered 2022-01-31 – 2022-02-05 (×6): 25 mg via ORAL
  Filled 2022-01-31 (×3): qty 1
  Filled 2022-01-31: qty 10
  Filled 2022-01-31 (×2): qty 1

## 2022-01-31 NOTE — Progress Notes (Signed)
Pt is A&OX4, calm, paranoid, isolative, guarded, denies suicidal ideations, denies homicidal ideations, denies auditory hallucinations and denies visual hallucinations. Pt verbally agrees to approach staff if these become apparent and before harming self or others. Pt denies experiencing nightmares. Mood and affect are congruent. Pt appetite is ok. No complaints of anxiety, distress, pain and/or discomfort at this time. Pt's memory appears to be grossly intact, and Pt hasn't displayed any injurious behaviors. Pt is medication compliant. There's no evidence of suicidal intent. Psychomotor activity was WNL. No s/s of Parkinson, Dystonia, Akathisia and/or Tardive Dyskinesia noted.

## 2022-01-31 NOTE — [facility] Group Notes (Signed)
Goals Group 78/2023   Group Focus: affirmation, clarity of thought, and goals/reality orientation Treatment Modality:  Psychoeducation Interventions utilized were assignment, group exercise, and support Purpose: To be able to understand and verbalize the reason for their admission to the hospital. To understand that the medication helps with their chemical imbalance but they also need to work on their choices in life. To be challenged to develop a list of 30 positives about themselves. Also introduce the concept that "feelings" are not reality.  Participation Level:  did not attend  Harry Jones A 

## 2022-01-31 NOTE — Progress Notes (Signed)
The focus of this group is to help patients review their daily goal of treatment and discuss progress on daily workbooks.  Pt attended the evening group but responded minimally to discussion prompts from the Writer. Pt shared that today was a good day on the unit, the highlight of which was "waking up this morning."  Pt told that, upon feeling stressed, he liked to go for a walk as a positive way to relax. "I usually feel calmer after I go for a walk."  Pt rated his day a 5 out of 10 and his affect was appropriate.

## 2022-01-31 NOTE — Progress Notes (Signed)
   01/31/22 0500  Sleep  Number of Hours 5.25

## 2022-01-31 NOTE — Progress Notes (Addendum)
[facility] MD Progress Note  01/31/2022 8:21 AM Harry Jones  MRN:  [id number] Subjective:    Harry Jones is a 66 year old male with a self-reported past psychiatric history of schizophrenia, bipolar disorder, and depression.  There is very little information available about this patient in the chart previous to 2 days ago.  He was transferred from the behavioral health urgent care to the [facility] behavioral health hospital due to suicidal thoughts with a plan to walk into traffic.  He has also been reporting command auditory hallucinations.  He is admitted on a voluntary basis.  Yesterday, the following recommendations were made:             -- Continue Seroquel 25 mg daily and increase to 50 mg QHS for psychosis             -- Continue home Cogentin 0.5 mg daily  Interval History: PRN Medications administered within the last 24 hours: Vistaril x2 Per nursing staff: the patient sat off to the side in group and stated he was too "claustrophobic" to join.  Per Patient:  On assessment today, the patient exhibits a brighter affect than yesterday. He wakes up from sleep and smiles at this provider.  He reports "the voices are better".  He reports my mood is "better".  He reports improving sleep.  He reports suicidal thoughts that "come and go" with no formed plan.  He reports visual hallucinations of shadows.    Was able to ask the patient about his previous history.  He reports that he [place] near [place].  He reports that he grew up in [place] and is correctly able to identify that this city is near [place].  He reports completing a 9th grade education.  He states "none of those people are friends.  I'll be frank.  If they are friends they would not have let me sleep under a damn bridge".  He reports that his parents were "okay" growing up.  He states they have died.  On mental status exam the patient is unable to state where he is, eventually stating "this  place [place]".  He states the year is 2022 and is unable to name the president.  He states the month is June.  Patient denies side effects to current scheduled psychiatric medications.   Patient denies other somatic complaints.  Patient seen later in the day with the attending physician. On this assessment the patient appears more irritable.  He reports feeling upset after group.  He seems suspicious of other people trying to interact with him.  He reports command auditory hallucinations that "come from inside my head" that told him to come sit down in bed after being upset in group.  When asked about first rank symptoms the patient replies "I do not know".  He admits he does not watch TV for fear of receiving messages. He reports being in [facility] previously for [facility].  He reports that when he was on the streets before admission he was using alcohol and cocaine.  Throughout the interview he appears guarded and suspicious.  However, he is amenable to changing medication.  He reports significant suicidal thoughts but denies having any form plan.  He denies any homicidal thoughts.  Principal Problem: Schizoaffective disorder ([place]) Diagnosis: Principal Problem:   Schizoaffective disorder ([place]) Active Problems:   Homelessness   Suicidal ideation  Total Time spent with patient: 15 minutes  Past Psychiatric  History: schizophrenia per patient report  Past Medical History: No past medical history on file. No past surgical history on file. Family History: No family history on file. Family Psychiatric  History: unknown Social History:  Social History   Substance and Sexual Activity  Alcohol Use None     Social History   Substance and Sexual Activity  Drug Use Not on file    Social History   Socioeconomic History   Marital status: Widowed    Spouse name: Not on file   Number of children: Not on file   Years of education: Not on file   Highest education  level: Not on file  Occupational History   Not on file  Tobacco Use   Smoking status: Every Day    Packs/day: 1.00    Types: Cigarettes   Smokeless tobacco: Never  Substance and Sexual Activity   Alcohol use: Not on file   Drug use: Not on file   Sexual activity: Not on file  Other Topics Concern   Not on file  Social History Narrative   Per chart review & Nursing report: Pt is homeless lives under a bridge. Wife died 7 years ago.   Social Determinants of Health   Financial Resource Strain: Not on file  Food Insecurity: Not on file  Transportation Needs: Not on file  Physical Activity: Not on file  Stress: Not on file  Social Connections: Not on file   Additional Social History:       Sleep: Fair  Appetite:  Fair  Current Medications: Current Facility-Administered Medications  Medication Dose Route Frequency Provider Last Rate Last Admin   acetaminophen (TYLENOL) tablet 650 mg  650 mg Oral Q6H PRN Princess Bruins, DO       alum & mag hydroxide-simeth (MAALOX/MYLANTA) 200-200-20 MG/5ML suspension 30 mL  30 mL Oral Q4H PRN Princess Bruins, DO       benztropine (COGENTIN) tablet 0.5 mg  0.5 mg Oral Daily Princess Bruins, DO   0.5 mg at 01/30/22 0820   benztropine (COGENTIN) tablet 1 mg  1 mg Oral BID PRN Carlyn Reichert, MD       Or   benztropine mesylate (COGENTIN) injection 2 mg  2 mg Intramuscular BID PRN Carlyn Reichert, MD       dorzolamide-timolol (COSOPT) 22.3-6.8 MG/ML ophthalmic solution 1 drop  1 drop Both Eyes BID Princess Bruins, DO   1 drop at 01/30/22 1828   haloperidol (HALDOL) tablet 5 mg  5 mg Oral Q8H PRN Carlyn Reichert, MD       Or   haloperidol lactate (HALDOL) injection 5 mg  5 mg Intramuscular Q8H PRN Carlyn Reichert, MD       hydrOXYzine (ATARAX) tablet 25 mg  25 mg Oral Q6H PRN Princess Bruins, DO   25 mg at 01/30/22 2033   loperamide (IMODIUM) capsule 2-4 mg  2-4 mg Oral PRN Princess Bruins, DO       LORazepam (ATIVAN) tablet 1 mg  1 mg Oral Q6H PRN Princess Bruins, DO       magnesium hydroxide (MILK OF MAGNESIA) suspension 30 mL  30 mL Oral Daily PRN Princess Bruins, DO       melatonin tablet 3 mg  3 mg Oral QHS Princess Bruins, DO   3 mg at 01/30/22 2033   multivitamin with minerals tablet 1 tablet  1 tablet Oral Daily Princess Bruins, DO   1 tablet at 01/30/22 0820   nicotine polacrilex (NICORETTE) gum 2 mg  2 mg Oral PRN Merrily Brittle, DO       ondansetron (ZOFRAN-ODT) disintegrating tablet 4 mg  4 mg Oral Q6H PRN Merrily Brittle, DO       QUEtiapine (SEROQUEL) tablet 25 mg  25 mg Oral Daily Corky Sox, MD   25 mg at 01/30/22 0929   QUEtiapine (SEROQUEL) tablet 50 mg  50 mg Oral QHS Corky Sox, MD   50 mg at 01/30/22 2033   rivaroxaban (XARELTO) tablet 20 mg  20 mg Oral Daily Merrily Brittle, DO   20 mg at 01/30/22 0820   thiamine tablet 100 mg  100 mg Oral Daily Merrily Brittle, DO   100 mg at 01/30/22 0820    Lab Results:  No results found for this or any previous visit (from the past 22 hour(s)).   Blood Alcohol level:  Lab Results  Component Value Date   ETH <10 [id number]    Metabolic Disorder Labs: Lab Results  Component Value Date   HGBA1C 5.5 01/27/2022   MPG 111.15 01/27/2022   No results found for: "PROLACTIN" Lab Results  Component Value Date   CHOL 187 01/27/2022   TRIG 46 01/27/2022   HDL 79 01/27/2022   CHOLHDL 2.4 01/27/2022   VLDL 9 01/27/2022   LDLCALC 99 01/27/2022    Physical Findings: AIMS: Facial and Oral Movements Muscles of Facial Expression: None, normal Lips and Perioral Area: None, normal Jaw: None, normal Tongue: None, normal,Extremity Movements Upper (arms, wrists, hands, fingers): None, normal Lower (legs, knees, ankles, toes): None, normal, Trunk Movements Neck, shoulders, hips: None, normal, Overall Severity Severity of abnormal movements (highest score from questions above): None, normal Incapacitation due to abnormal movements: None, normal Patient's awareness of abnormal movements (rate  only patient's report): No Awareness, Dental Status Current problems with teeth and/or dentures?: No Does patient usually wear dentures?: No  CIWA:  CIWA-Ar Total: 1 COWS:     Musculoskeletal: Strength & Muscle Tone: within normal limits Gait & Station: normal Patient leans: N/A  Psychiatric Specialty Exam:  Presentation  General Appearance: Disheveled  Eye Contact:Poor  Speech:Clear and Coherent  Speech Volume:Decreased  Handedness:Right   Mood and Affect  Mood: initially reported as "good", later is irritable Affect:Constricted   Thought Process  Thought Processes: relatively linear, but concrete with no abstract thought Descriptions of Associations:Intact  Orientation:Full (Time, Place and Person)  Thought Content: Patient reports passive SI but contracts for safety; he denies HI; he reports command AH and VH of shadows; he is vague when asked about first rank symptoms and reports concern for possible messages from TV; he is not grossly responding to internal/external stimuli on exam but admits to paranoia and is guarded  History of Schizophrenia/Schizoaffective disorder:-- (unknown)  Duration of Psychotic Symptoms:-- (unknown)  Hallucinations: command auditory hallucinations telling him positive things today Ideas of Reference:concern he could get messages from TV  Suicidal Thoughts:passive without intent or plan  Homicidal Thoughts:Homicidal Thoughts: No   Sensorium  Memory:Immediate Poor; Recent Poor; Remote Poor  Judgment:Poor  Insight:Poor   Executive Functions  Concentration:Poor  Attention Span:Poor  Recall:Poor  Fund of Knowledge:Poor  Language:Poor   Psychomotor Activity  Psychomotor Activity:Psychomotor Activity: Normal   Assets  Assets:Communication Skills; Desire for Improvement; Resilience; Leisure Time   Sleep  5.25 hours   Physical Exam Constitutional:      Appearance: the patient is not toxic-appearing.   Pulmonary:     Effort: Pulmonary effort is normal.  Neurological:     General: No focal deficit  present.     Mental Status: the patient is alert and oriented to person, place, and time.   Review of Systems  Respiratory:  Negative for shortness of breath.   Cardiovascular:  Negative for chest pain.  Gastrointestinal:  Negative for abdominal pain, constipation, diarrhea, nausea and vomiting.  Neurological:  Negative for headaches.   Blood pressure 108/83, pulse 77, temperature 98.3 F (36.8 C), temperature source Oral, resp. rate 17, height 6\' 5"  (1.956 m), weight 102.1 kg, SpO2 99 %. Body mass index is 26.68 kg/m.   Treatment Plan Summary: Daily contact with patient to assess and evaluate symptoms and progress in treatment and Medication management  ASSESSMENT:   Diagnoses / Active Problems: Limited information for diagnosis at this time Schizoaffective disorder, unspecified type (r/o schizophrenia) R/o Stimulant (cocaine type) and cannabis use disorders  PLAN: Safety and Monitoring:             --  Voluntary admission to inpatient psychiatric unit for safety, stabilization and treatment             -- Daily contact with patient to assess and evaluate symptoms and progress in treatment             -- Patient's case to be discussed in multi-disciplinary team meeting             -- Observation Level : q15 minute checks             -- Vital signs:  q12 hours             -- Precautions: suicide, elopement, and assault   2. Psychiatric Diagnoses and Treatment:  Schizoaffective disorder, unspecified type             -- Start risperidone 1 mg QHS  -- Plan for risperidone 0.5 mg AM and 1 mg QHS tomorrow             -- Discontinue home Cogentin 0.5 mg daily, continue PRN  -- Stop seroquel  -- Continue melatonin 3mg  qhs -- The risks/benefits/side-effects/alternatives to this medication were discussed in detail with the patient and time was given for questions. The patient consents  to medication trial.              -- Metabolic profile and EKG monitoring obtained while on an atypical antipsychotic (BMI: 27 Lipid Panel: WNL HbgA1c: WNL QTc: 442)              -- Encouraged patient to participate in unit milieu and in scheduled group therapies              -- Agitation protocol with Haldol 5 mg PO/IM and Cogentin PO/IM             -- Short Term Goals: Ability to identify changes in lifestyle to reduce recurrence of condition will improve and Ability to verbalize feelings will improve             -- Long Term Goals: Improvement in symptoms so as ready for discharge                3. Medical Issues Being Addressed:              Tobacco Use Disorder             -- Nicotine patch 21mg /24 hours ordered             -- Smoking cessation encouraged  VTE or other coagulopathy             -- Continue home Xarelto - attempting to get collateral as to reason he is on this med and outpatient prescriber   Glaucoma  -- Continue home Cosopt drops   4. Discharge Planning:              -- Social work and case management to assist with discharge planning and identification of hospital follow-up needs prior to discharge             -- Estimated LOS: 5-7 days             -- Discharge Concerns: Need to establish a safety plan; Medication compliance and effectiveness             -- Discharge Goals: Return home with outpatient referrals for mental health follow-up including medication management/psychotherapy   Carlyn Reichert, MD 01/31/2022, 8:21 AM

## 2022-01-31 NOTE — Group Note (Signed)
  [facility] LCSW Group Therapy Note  Date/Time:  01/31/2022 11:15am-12:00pm  Type of Therapy and Topic:  Group Therapy:  Self-Care after Hospitalization  Participation Level:  Did Not Attend   Description of Group This process group involved patients discussing how they plan to take care of themselves in a better manner when they get home from the hospital.  The group started with patients listing one healthy and one unhealthy way they took care of themselves prior to hospitalization.  A discussion ensued about the differences in healthy and unhealthy coping skills.  Group members shared ideas about making changes when they return home so that they can stay well and in recovery.  The white board was used to list ideas so that patients can continue to see these ideas throughout the day.  Therapeutic Goals Patient will identify and describe one healthy and one unhealthy coping technique used prior to hospitalization Patient will participate in generating ideas about healthy self-care options when they return to the community Patients will be supportive of one another and receive said support from others Patient will identify one healthy self-care activity to add to his/her post-hospitalization life that can help in recovery  Summary of Patient Progress:  The patient was invited to group, did not attend.   Therapeutic Modalities Brief Solution-Focused Therapy Motivational Interviewing Psychoeducation   Anyia Gierke Grossman-Orr, LCSW 01/31/2022, 12:00pm    

## 2022-01-31 NOTE — Progress Notes (Signed)
   01/31/22 2015  Psych Admission Type (Psych Patients Only)  Admission Status Voluntary  Psychosocial Assessment  Patient Complaints Self-harm thoughts;Anxiety;Depression  Eye Contact Fair  Facial Expression Anxious  Affect Anxious  Speech Logical/coherent  Interaction Guarded  Motor Activity Slow  Appearance/Hygiene Unremarkable  Behavior Characteristics Cooperative  Mood Depressed;Anxious;Suspicious  Thought Process  Coherency WDL  Content WDL  Delusions None reported or observed  Perception WDL;Hallucinations  Hallucination Auditory;Command (tell him to harm himself)  Judgment Impaired  Confusion None  Danger to Self  Current suicidal ideation? Passive  Self-Injurious Behavior No self-injurious ideation or behavior indicators observed or expressed   Agreement Not to Harm Self Yes  Description of Agreement verbal  Danger to Others  Danger to Others None reported or observed   Progress note   D: Pt seen at med window. Pt denies HI, VH. Pt endorses passive SI d/t command AH that tell him to harm himself. Pt denies a plan and contracts for safety. Pt rates pain  0/10. Pt rates anxiety  6/10 and depression  6/10. Pt states that he goes to sleep when the voices start telling him to harm himself. Pt denies any withdrawal symptoms. Pt says that the one good thing about his day today is that he is still alive. No other concerns noted.  A: Pt provided support and encouragement. Pt given scheduled medication as prescribed. PRNs as appropriate. Q15 min checks for safety.   R: Pt safe on the unit. Will continue to monitor.

## 2022-02-01 MED ORDER — LORAZEPAM 2 MG/ML IJ SOLN: 1.0000 mg | Freq: Four times a day (QID) | INTRAMUSCULAR | Status: DC | PRN

## 2022-02-01 MED ORDER — LORAZEPAM 1 MG PO TABS: 1.0000 mg | ORAL_TABLET | Freq: Four times a day (QID) | ORAL | Status: DC | PRN

## 2022-02-01 MED ORDER — ENSURE ENLIVE PO LIQD
237.0000 mL | Freq: Two times a day (BID) | ORAL | Status: DC
  Administered 2022-02-01 – 2022-02-04 (×5): 237 mL via ORAL
  Filled 2022-02-01 (×12): qty 237

## 2022-02-01 MED ORDER — BENZTROPINE MESYLATE 1 MG PO TABS
1.0000 mg | ORAL_TABLET | Freq: Two times a day (BID) | ORAL | Status: DC | PRN
  Filled 2022-02-01: qty 14

## 2022-02-01 MED ORDER — BENZTROPINE MESYLATE 1 MG/ML IJ SOLN: 1.0000 mg | Freq: Two times a day (BID) | INTRAMUSCULAR | Status: DC | PRN

## 2022-02-01 NOTE — Progress Notes (Addendum)
[facility] MD Progress Note  02/01/2022 1:58 PM Harry Jones  MRN:  [id number] Subjective:    Harry Jones is a 66 year old male with a self-reported past psychiatric history of schizophrenia, bipolar disorder, and depression.  There is very little information available about this patient in the chart previous to 2 days ago.  He was transferred from the behavioral health urgent care to the [facility] behavioral health hospital due to suicidal thoughts with a plan to walk into traffic.  He has also been reporting command auditory hallucinations.  He is admitted on a voluntary basis.  Yesterday, the following recommendations were made:              -- Start risperidone 1 mg QHS  -- Plan for risperidone 0.5 mg AM and 1 mg QHS tomorrow  -- Discontinued standing home Cogentin, started PRN  Interval History: PRN Medications administered within the last 24 hours: Vistaril x2 Per nursing staff: the patient was distressed this morning because of a vivid dream  Per Patient:  On assessment today, the patient is initially visibly agitated about the vivid dream that he had this morning.  He states that he found himself with his hands around his throat upon awakening.  He says that he was so scared that he ran out into the hallway and needed to be reassured by nursing staff that he was okay.  The patient states that he avoided the cafeteria as a result of this. He reports paranoia about the food stating "I have seen them put things in the food". He also states that being around a lot of people scares him.  He reports suicidal thoughts with no formed plan.  He reports command auditory hallucinations that are "slowing down".  He reports continued worry about getting messages from the television, which is why he has been avoiding the day room.  He reports experiencing visual hallucinations of shadows last night that caused him to "stand up on the bed".   He is asked orientation questions, with notable speech latency, taking up  to 15 seconds to answer questions.  He is able to state that the day is Sunday, that the month is July and that the year is 2023 (he has written this down on a cup that he has to retrieve).  He remains unable to state who the president is despite being told this previously.  The patient mentions that he has a sister and states that the care team is allowed to contact her.  This number is later found.  Plan to call the patient's sister soon.  Patient denies side effects to current scheduled psychiatric medications.   Patient denies other somatic complaints.   Principal Problem: Schizoaffective disorder (HCC) Diagnosis: Principal Problem:   Schizoaffective disorder (HCC) Active Problems:   Homelessness   Suicidal ideation  Total Time spent with patient: 15 minutes  Past Psychiatric History: schizophrenia per patient report  Past Medical History: No past medical history on file. No past surgical history on file. Family History: No family history on file. Family Psychiatric  History: unknown Social History:  Social History   Substance and Sexual Activity  Alcohol Use None     Social History   Substance and Sexual Activity  Drug Use Not on file    Social History   Socioeconomic History   Marital status: Widowed    Spouse name: Not on file   Number of children: Not on file   Years of education: Not on file  Highest education level: Not on file  Occupational History   Not on file  Tobacco Use   Smoking status: Every Day    Packs/day: 1.00    Types: Cigarettes   Smokeless tobacco: Never  Substance and Sexual Activity   Alcohol use: Not on file   Drug use: Not on file   Sexual activity: Not on file  Other Topics Concern   Not on file  Social History Narrative   Per chart review & Nursing report: Pt is homeless lives under a bridge. Wife died 7 years ago.   Social Determinants of Health   Financial Resource Strain: Not on file  Food Insecurity: Not on file   Transportation Needs: Not on file  Physical Activity: Not on file  Stress: Not on file  Social Connections: Not on file   Additional Social History:       Sleep: Fair  Appetite:  Fair  Current Medications: Current Facility-Administered Medications  Medication Dose Route Frequency Provider Last Rate Last Admin   acetaminophen (TYLENOL) tablet 650 mg  650 mg Oral Q6H PRN Princess BruinsNguyen, Julie, DO       alum & mag hydroxide-simeth (MAALOX/MYLANTA) 200-200-20 MG/5ML suspension 30 mL  30 mL Oral Q4H PRN Princess BruinsNguyen, Julie, DO       benztropine (COGENTIN) tablet 1 mg  1 mg Oral BID PRN Carlyn ReichertGabrielle, Nick, MD       Or   benztropine mesylate (COGENTIN) injection 2 mg  2 mg Intramuscular BID PRN Carlyn ReichertGabrielle, Nick, MD       dorzolamide-timolol (COSOPT) 22.3-6.8 MG/ML ophthalmic solution 1 drop  1 drop Both Eyes BID Princess BruinsNguyen, Julie, DO   1 drop at 02/01/22 0745   haloperidol (HALDOL) tablet 5 mg  5 mg Oral Q8H PRN Carlyn ReichertGabrielle, Nick, MD       Or   haloperidol lactate (HALDOL) injection 5 mg  5 mg Intramuscular Q8H PRN Carlyn ReichertGabrielle, Nick, MD       hydrOXYzine (ATARAX) tablet 25 mg  25 mg Oral TID PRN Ajibola, Ene A, NP   25 mg at 02/01/22 0557   magnesium hydroxide (MILK OF MAGNESIA) suspension 30 mL  30 mL Oral Daily PRN Princess BruinsNguyen, Julie, DO       melatonin tablet 3 mg  3 mg Oral QHS Princess BruinsNguyen, Julie, DO   3 mg at 01/31/22 2031   multivitamin with minerals tablet 1 tablet  1 tablet Oral Daily Princess BruinsNguyen, Julie, DO   1 tablet at 02/01/22 0745   nicotine (NICODERM CQ - dosed in mg/24 hours) patch 21 mg  21 mg Transdermal Daily Carlyn ReichertGabrielle, Nick, MD   21 mg at 02/01/22 0746   risperiDONE (RISPERDAL) tablet 0.5 mg  0.5 mg Oral Daily Carlyn ReichertGabrielle, Nick, MD   0.5 mg at 02/01/22 0746   risperiDONE (RISPERDAL) tablet 1 mg  1 mg Oral QHS Carlyn ReichertGabrielle, Nick, MD   1 mg at 01/31/22 2031   rivaroxaban (XARELTO) tablet 20 mg  20 mg Oral Daily Princess BruinsNguyen, Julie, DO   20 mg at 02/01/22 0746   thiamine tablet 100 mg  100 mg Oral Daily Princess BruinsNguyen, Julie, DO    100 mg at 02/01/22 [id number]    Lab Results:  No results found for this or any previous visit (from the past 48 hour(s)).   Blood Alcohol level:  Lab Results  Component Value Date   ETH <10 01/27/2022    Metabolic Disorder Labs: Lab Results  Component Value Date   HGBA1C 5.5 01/27/2022   MPG 111.15 01/27/2022  No results found for: "PROLACTIN" Lab Results  Component Value Date   CHOL 187 01/27/2022   TRIG 46 01/27/2022   HDL 79 01/27/2022   CHOLHDL 2.4 01/27/2022   VLDL 9 01/27/2022   LDLCALC 99 01/27/2022    Physical Findings: AIMS: Facial and Oral Movements Muscles of Facial Expression: None, normal Lips and Perioral Area: None, normal Jaw: None, normal Tongue: None, normal,Extremity Movements Upper (arms, wrists, hands, fingers): None, normal Lower (legs, knees, ankles, toes): None, normal, Trunk Movements Neck, shoulders, hips: None, normal, Overall Severity Severity of abnormal movements (highest score from questions above): None, normal Incapacitation due to abnormal movements: None, normal Patient's awareness of abnormal movements (rate only patient's report): No Awareness, Dental Status Current problems with teeth and/or dentures?: No Does patient usually wear dentures?: No  CIWA:  CIWA-Ar Total: 4 COWS:     Musculoskeletal: Strength & Muscle Tone: within normal limits Gait & Station: normal Patient leans: N/A  Psychiatric Specialty Exam:  Presentation  General Appearance: Disheveled  Eye Contact:Fair  Speech:Clear and Coherent  Speech Volume:Decreased  Handedness:Right   Mood and Affect  Mood: initially irritable but then becomes more responsive Affect:Constricted   Thought Process  Thought Processes: relatively linear, but concrete with no abstract thought Descriptions of Associations:Intact  Orientation:Full (Time, Place and Person)  Thought Content: Patient reports passive SI but contracts for safety; he denies HI; he reports  command AH and VH of shadows; he is vague when asked about first rank symptoms and reports concern for possible messages from TV; he is not grossly responding to internal/external stimuli on exam but admits to paranoia and is guarded  History of Schizophrenia/Schizoaffective disorder:-- (unknown)  Duration of Psychotic Symptoms:-- (unknown)  Hallucinations: command auditory hallucinations decreasing in intensity Ideas of Reference:concern he could get messages from TV  Suicidal Thoughts:passive without intent or plan  Homicidal Thoughts: denies   Sensorium  Memory:Immediate Poor; Recent Poor; Remote Poor  Judgment:Poor  Insight:Poor   Executive Functions  Concentration:Poor  Attention Span:Poor  Recall:Poor  Fund of Knowledge:Poor  Language:Poor   Psychomotor Activity  Psychomotor Activity:No data recorded   Assets  Assets:Communication Skills; Desire for Improvement; Resilience; Leisure Time   Sleep  fair   Physical Exam Constitutional:      Appearance: the patient is not toxic-appearing.  Pulmonary:     Effort: Pulmonary effort is normal.  Neurological:     General: No focal deficit present.     Mental Status: the patient is alert and oriented to person, place, and time.   Review of Systems  Respiratory:  Negative for shortness of breath.   Cardiovascular:  Negative for chest pain.  Gastrointestinal:  Negative for abdominal pain, constipation, diarrhea, nausea and vomiting.  Neurological:  Negative for headaches.   Blood pressure 116/81, pulse 82, temperature 97.8 F (36.6 C), temperature source Oral, resp. rate 17, height 6\' 5"  (1.956 m), weight 102.1 kg, SpO2 100 %. Body mass index is 26.68 kg/m.   Treatment Plan Summary: Daily contact with patient to assess and evaluate symptoms and progress in treatment and Medication management  ASSESSMENT:   Diagnoses / Active Problems: Limited information for diagnosis at this time Schizoaffective  disorder, unspecified type (r/o schizophrenia) R/o Stimulant (cocaine type) and cannabis use disorders  PLAN: Safety and Monitoring:             --  Voluntary admission to inpatient psychiatric unit for safety, stabilization and treatment             --  Daily contact with patient to assess and evaluate symptoms and progress in treatment             -- Patient's case to be discussed in multi-disciplinary team meeting             -- Observation Level : q15 minute checks             -- Vital signs:  q12 hours             -- Precautions: suicide, elopement, and assault   2. Psychiatric Diagnoses and Treatment:  Schizoaffective disorder, unspecified type  -- Continue risperidone 0.5 mg AM and 1 mg QHS for psychosis             -- Discontinue home Cogentin 0.5 mg daily, continue PRN  -- Continue melatonin 3mg  qhs -- The risks/benefits/side-effects/alternatives to this medication were discussed in detail with the patient and time was given for questions. The patient consents to medication trial.              -- Metabolic profile and EKG monitoring obtained while on an atypical antipsychotic (BMI: 27 Lipid Panel: WNL HbgA1c: WNL QTc: 442)              -- Encouraged patient to participate in unit milieu and in scheduled group therapies              -- Agitation protocol with Haldol 5 mg PO/IM and Cogentin PO/IM and add Ativan 1mg  po and IM for agitation PRN           -- continue MVI and thiamine oral replacement and add Ensure - will attempt to have staff offer snacks, drinks, and meal options that he can open himself due to paranoia with food               R/o Stimulant (cocaine type) and cannabis use disorders (UDS positive for cocaine and THC)  -- monitoring for signs of withdrawal  -- will need meaningful discussion about abstinence from substance use as he clears  -- Continue CIWA monitoring in event he is not reporting alcohol use prior to admission (recent scores 0,3,4)  3. Medical Issues  Being Addressed:              Tobacco Use Disorder             -- Nicotine patch 21mg /24 hours ordered             -- Smoking cessation encouraged              Reported h/o pulmonary embolus per patient report             -- Continue home Xarelto 20mg  daily  -- Patient states he "had a clot on my lungs" - will attempt to clarify with PCP and sister   Glaucoma  -- Continue home Cosopt drops   4. Discharge Planning:              -- Social work and case management to assist with discharge planning and identification of hospital follow-up needs prior to discharge             -- Estimated LOS: 5-7 days             -- Discharge Concerns: Need to establish a safety plan; Medication compliance and effectiveness             -- Discharge Goals: Return home with outpatient referrals for mental health follow-up including  medication management/psychotherapy   Corky Sox, MD 02/01/2022, 1:58 PM

## 2022-02-01 NOTE — [facility] Group Notes (Signed)
Adult Psychoeducational Group Note Date:  02/01/2022 Time:  0900-1045 Group Topic/Focus: PROGRESSIVE RELAXATION. A group where deep breathing is taught and tensing and relaxation muscle groups is used. Imagery is used as well.  Pts are asked to imagine 3 pillars that hold them up when they are not able to hold themselves up and to share that with the group.  Participation Level:  did not attend  Lithzy Bernard A  

## 2022-02-01 NOTE — Group Note (Signed)
[facility] LCSW Group Therapy Note  Date/Time:  02/01/2022 10:00am-11:00am  Type of Therapy and Topic:  Group Therapy:  Healthy and Unhealthy Supports  Participation Level:  Did Not Attend   Description of Group:  Patients in this group were introduced to the idea of adding a variety of healthy supports to address the various needs in their lives, especially in reference to their plans and focus for the new year.  Patients discussed what additional healthy supports could be helpful in their recovery and wellness after discharge in order to prevent future hospitalizations such as counselor, doctor, other levels of psychiatric care such as ACTT services, therapy groups, 12-step groups, and problem-specific support groups.  A demonstration was given about how to set boundaries which patients expressed was beneficial.  Several songs were played to inspire patients to be more self-supportive.  Therapeutic Goals:   1)  discuss importance of adding supports to stay well once out of the hospital  2)  compare healthy versus unhealthy supports and identify some examples of each  3)  generate ideas and descriptions of healthy supports that can be added  4)  offer mutual support about how to address unhealthy supports  5)  encourage active participation in and adherence to discharge plan    Summary of Patient Progress:  The patient was invited to group, did not attend.   Therapeutic Modalities:   Motivational Interviewing Brief Solution-Focused Therapy  Marlet Korte Grossman-Orr, LCSW       

## 2022-02-01 NOTE — Progress Notes (Signed)
Adult Psychoeducational Group Note  Date:  02/01/2022 Time:  9:01 PM  Group Topic/Focus:  Wrap-Up Group:   The focus of this group is to help patients review their daily goal of treatment and discuss progress on daily workbooks.  Participation Level:  Minimal  Participation Quality:  Appropriate  Affect:  Depressed and Irritable  Cognitive:  Disorganized and Confused  Insight: Limited  Engagement in Group:  Limited  Modes of Intervention:  Discussion  Additional Comments:  Pt stated his goal for today was to focus on his treatment plan. Pt stated he accomplished his goal today. Pt stated he talked with his doctor and social worker about his care today. Pt rated his overall day a 3 out of 10. Pt stated he was able to contact his mother today which improved his overall day. Pt stated he felt better about himself today. Pt stated he was able to attend all meals. Pt stated he took all medications provided today. Pt stated he attend all groups held today. Pt stated his appetite was fair today. Pt rated sleep last night was fair. Pt stated the goal tonight was to get some rest. Pt stated he had no physical pain tonight. Pt deny visual hallucinations and auditory issues tonight. Pt denies thoughts of harming himself or others. Pt stated he would alert staff if anything changed  [name] 02/01/2022, 9:01 PM

## 2022-02-01 NOTE — Progress Notes (Addendum)
Pt wanted to speak to this writer during his vital sign assessment this morning. "Someone needs to watch me. I woke up choking myself with my hands around my neck." Pt states that this happened right before he woke up to come down to the dayroom. Pt reassured that he is safe on unit and that he is observed every 15 minutes.  Pt given PRNs as appropriate to help him calm down from his experience. Pt advised that sleeping with a nicotine patch on his arm can lead to vivid dreams. Pt stated that last night was the first night he had one.

## 2022-02-02 LAB — URINALYSIS, ROUTINE W REFLEX MICROSCOPIC
Bilirubin Urine: NEGATIVE
Glucose, UA: NEGATIVE mg/dL
Ketones, ur: NEGATIVE mg/dL
Leukocytes,Ua: NEGATIVE
Nitrite: NEGATIVE
Protein, ur: 100 mg/dL — AB
RBC / HPF: >50 RBC/hpf — ABNORMAL HIGH (ref 0–5)
Specific Gravity, Urine: 1.024 (ref 1.005–1.030)
pH: 6 (ref 5.0–8.0)

## 2022-02-02 LAB — URINALYSIS, COMPLETE (UACMP) WITH MICROSCOPIC
Bilirubin Urine: NEGATIVE
Glucose, UA: NEGATIVE mg/dL
Ketones, ur: NEGATIVE mg/dL
Nitrite: NEGATIVE
Protein, ur: 30 mg/dL — AB
RBC / HPF: >50 RBC/hpf — ABNORMAL HIGH (ref 0–5)
Specific Gravity, Urine: 1.023 (ref 1.005–1.030)
pH: 7 (ref 5.0–8.0)

## 2022-02-02 MED ORDER — RISPERIDONE 0.5 MG PO TABS
0.5000 mg | ORAL_TABLET | Freq: Once | ORAL | Status: AC
Start: 2022-02-02 — End: 2022-02-02
  Administered 2022-02-02: 0.5 mg via ORAL
  Filled 2022-02-02 (×2): qty 1

## 2022-02-02 MED ORDER — RISPERIDONE 1 MG PO TABS
1.0000 mg | ORAL_TABLET | Freq: Every day | ORAL | Status: DC
  Administered 2022-02-03 – 2022-02-04 (×2): 1 mg via ORAL
  Filled 2022-02-02 (×3): qty 1

## 2022-02-02 NOTE — Progress Notes (Signed)
[facility]  02/02/2022 1:50 PM Harry Jones  MRN:  [id number] Subjective:    Harry Jones is a 66 year old male with a self-reported past psychiatric history of schizophrenia, bipolar disorder, and depression.  There is very little information available about this patient in the chart previous to 2 days ago.  He was transferred from the behavioral health urgent care [facility] due to suicidal thoughts with a plan to walk into traffic.  He has also been reporting command auditory hallucinations.  He is admitted on a voluntary basis.  Yesterday, the following recommendations were made:              -- Continue risperidone 0.5 mg AM and 1 mg QHS for psychosis  Interval History: PRN Medications administered within the last 24 hours: Vistaril x1 Per nursing staff: patient reported blood in his urine. Denied dysuria. UA obtained showing >50 RBCs. No other problems noted.   Per Patient:  On assessment today, the patient is found staring at the wall in his room.  He reports that his mood is "down".  His affect appears more depressed than previous days.  He reports that he has been talking to his deceased wife.  He states "I thought I was getting over her".  He states that she died of colon cancer a few years ago.  He says "I miss her".  He states that before she died he had 2 jobs, one working dishes, and one working in a [employer].  He reports that he has been eating but states that he only eats sealed food.  He feels that the staff are putting things in his food.  Discussed with him that he can be given sandwiches in unopened containers that he appears relieved by this.  He reports suicidal thoughts with a plan to stop eating.  He denies homicidal thoughts.  He reports continued visual hallucinations of shadows.  He reports auditory hallucinations telling him to not eat the food.  Patient denies side effects to current scheduled psychiatric medications.    Patient denies other somatic complaints.   Principal Problem: Schizoaffective disorder (HCC) Diagnosis: Principal Problem:   Schizoaffective disorder (HCC) Active Problems:   Homelessness   Suicidal ideation  Total Time spent with patient: 15 minutes  Past Psychiatric History: schizophrenia per patient report  Past Medical History: No past medical history on file. No past surgical history on file. Family History: No family history on file. Family Psychiatric  History: unknown Social History:  Social History   Substance and Sexual Activity  Alcohol Use None     Social History   Substance and Sexual Activity  Drug Use Not on file    Social History   Socioeconomic History   Marital status: Widowed    Spouse name: Not on file   Number of children: Not on file   Years of education: Not on file   Highest education level: Not on file  Occupational History   Not on file  Tobacco Use   Smoking status: Every Day    Packs/day: 1.00    Types: Cigarettes   Smokeless tobacco: Never  Substance and Sexual Activity   Alcohol use: Not on file   Drug use: Not on file   Sexual activity: Not on file  Other Topics Concern   Not on file  Social History Narrative   Per chart review & Nursing report: Pt is homeless lives under a bridge. Wife died 7 years  ago.   Social Determinants of Corporate investment banker Strain: Not on file  Food Insecurity: Not on file  Transportation Needs: Not on file  Physical Activity: Not on file  Stress: Not on file  Social Connections: Not on file   Additional Social History:       Sleep: Fair  Appetite:  Fair  Current Medications: Current Facility-Administered Medications  Medication Dose Route Frequency Provider Last Rate Last Admin   acetaminophen (TYLENOL) tablet 650 mg  650 mg Oral Q6H PRN Princess Bruins, DO       alum & mag hydroxide-simeth (MAALOX/MYLANTA) 200-200-20 MG/5ML suspension 30 mL  30 mL Oral Q4H PRN Princess Bruins, DO        benztropine (COGENTIN) tablet 1 mg  1 mg Oral BID PRN Comer Locket, MD       Or   benztropine mesylate (COGENTIN) injection 1 mg  1 mg Intramuscular BID PRN Mason Jim, Amy E, MD       dorzolamide-timolol (COSOPT) 22.3-6.8 MG/ML ophthalmic solution 1 drop  1 drop Both Eyes BID Princess Bruins, DO   1 drop at 02/02/22 1216   feeding supplement (ENSURE ENLIVE / ENSURE PLUS) liquid 237 mL  237 mL Oral BID BM Mason Jim, Amy E, MD   237 mL at 02/02/22 1346   haloperidol (HALDOL) tablet 5 mg  5 mg Oral Q8H PRN Carlyn Reichert, MD       Or   haloperidol lactate (HALDOL) injection 5 mg  5 mg Intramuscular Q8H PRN Carlyn Reichert, MD       hydrOXYzine (ATARAX) tablet 25 mg  25 mg Oral TID PRN Ajibola, Ene A, NP   25 mg at 02/01/22 2058   LORazepam (ATIVAN) tablet 1 mg  1 mg Oral Q6H PRN Comer Locket, MD       Or   LORazepam (ATIVAN) injection 1 mg  1 mg Intramuscular Q6H PRN Mason Jim, Amy E, MD       magnesium hydroxide (MILK OF MAGNESIA) suspension 30 mL  30 mL Oral Daily PRN Princess Bruins, DO       melatonin tablet 3 mg  3 mg Oral QHS Princess Bruins, DO   3 mg at 02/01/22 2058   multivitamin with minerals tablet 1 tablet  1 tablet Oral Daily Princess Bruins, DO   1 tablet at 02/02/22 8850   nicotine (NICODERM CQ - dosed in mg/24 hours) patch 21 mg  21 mg Transdermal Daily Carlyn Reichert, MD   21 mg at 02/02/22 0815   risperiDONE (RISPERDAL) tablet 1 mg  1 mg Oral QHS Carlyn Reichert, MD   1 mg at 02/01/22 2058   [START ON 02/03/2022] risperiDONE (RISPERDAL) tablet 1 mg  1 mg Oral Daily Carlyn Reichert, MD       rivaroxaban Carlena Hurl) tablet 20 mg  20 mg Oral Daily Princess Bruins, DO   20 mg at 02/02/22 2774   thiamine tablet 100 mg  100 mg Oral Daily Princess Bruins, DO   100 mg at 02/02/22 1287    Lab Results:  Results for orders placed or performed during the hospital encounter of 01/28/22 (from the past 48 hour(s))  Urinalysis, Routine w reflex microscopic Urine, Clean Catch     Status:  Abnormal   Collection Time: 02/01/22  9:18 PM  Result Value Ref Range   Color, Urine YELLOW YELLOW   APPearance CLOUDY (A) CLEAR   Specific Gravity, Urine 1.024 1.005 - 1.030   pH 6.0 5.0 - 8.0  Glucose, UA NEGATIVE NEGATIVE mg/dL   Hgb urine dipstick LARGE (A) NEGATIVE   Bilirubin Urine NEGATIVE NEGATIVE   Ketones, ur NEGATIVE NEGATIVE mg/dL   Protein, ur 563 (A) NEGATIVE mg/dL   Nitrite NEGATIVE NEGATIVE   Leukocytes,Ua NEGATIVE NEGATIVE   RBC / HPF >50 (H) 0 - 5 RBC/hpf   WBC, UA 6-10 0 - 5 WBC/hpf   Bacteria, UA FEW (A) NONE SEEN    Comment: Performed at [facility], [address] [place], [place] [address]     Blood Alcohol level:  Lab Results  Component Value Date   ETH <10 01/27/2022    Metabolic Disorder Labs: Lab Results  Component Value Date   HGBA1C 5.5 01/27/2022   MPG 111.15 01/27/2022   No results found for: "PROLACTIN" Lab Results  Component Value Date   CHOL 187 01/27/2022   TRIG 46 01/27/2022   HDL 79 01/27/2022   CHOLHDL 2.4 01/27/2022   VLDL 9 01/27/2022   LDLCALC 99 01/27/2022    Physical Findings: AIMS: Facial and Oral Movements Muscles of Facial Expression: None, normal Lips and Perioral Area: None, normal Jaw: None, normal Tongue: None, normal,Extremity Movements Upper (arms, wrists, hands, fingers): None, normal Lower (legs, knees, ankles, toes): None, normal, Trunk Movements Neck, shoulders, hips: None, normal, Overall Severity Severity of abnormal movements (highest score from questions above): None, normal Incapacitation due to abnormal movements: None, normal Patient's awareness of abnormal movements (rate only patient's report): No Awareness, Dental Status Current problems with teeth and/or dentures?: No Does patient usually wear dentures?: No  CIWA:  CIWA-Ar Total: 0 COWS:     Musculoskeletal: Strength & Muscle Tone: within normal limits Gait & Station: normal Patient leans: N/A  Psychiatric  Specialty Exam:  Presentation  General Appearance: Disheveled  Eye Contact:Fair  Speech:Clear and Coherent  Speech Volume:Decreased  Handedness:Right   Mood and Affect  Mood: initially depressed but becomes more responsive Affect:Constricted   Thought Process  Thought Processes: relatively linear, but concrete with no abstract thought Descriptions of Associations:Intact  Orientation:Full (Time, Place and Person)  Thought Content: Patient reports SI with a plan to stop eating (patient has been eating) he denies HI; he reports command AH and VH of shadows; he is vague when asked about first rank symptoms and reports concern for possible messages from TV; he is not grossly responding to internal/external stimuli on exam but admits to paranoia and is guarded  History of Schizophrenia/Schizoaffective disorder:-- (unknown)  Duration of Psychotic Symptoms:-- (unknown)  Hallucinations: command auditory hallucinations telling him to not eat the food Ideas of Reference: none reported today  Suicidal Thoughts: endorses, with a plan to stop eating (patient has been eating)  Homicidal Thoughts: denies   Sensorium  Memory:Immediate Poor; Recent Poor; Remote Poor  Judgment:Poor  Insight:Poor   Executive Functions  Concentration:Poor  Attention Span:Poor  Recall:Poor  Fund of Knowledge:Poor  Language:Poor   Psychomotor Activity  Psychomotor Activity:No data recorded   Assets  Assets:Communication Skills; Desire for Improvement; Resilience; Leisure Time   Sleep  fair   Physical Exam Constitutional:      Appearance: the patient is not toxic-appearing.  Pulmonary:     Effort: Pulmonary effort is normal.  Neurological:     General: No focal deficit present.     Mental Status: the patient is alert and oriented to person, place, and time.   Review of Systems  Respiratory:  Negative for shortness of breath.   Cardiovascular:  Negative for chest pain.  Gastrointestinal:  Negative for abdominal pain, constipation, diarrhea, nausea and vomiting.  Neurological:  Negative for headaches.   Blood pressure 108/81, pulse 76, temperature 97.7 F (36.5 C), temperature source Oral, resp. rate 17, height 6\' 5"  (1.956 m), weight 102.1 kg, SpO2 100 %. Body mass index is 26.68 kg/m.   Treatment Plan Summary: Daily contact with patient to assess and evaluate symptoms and progress in treatment and Medication management  ASSESSMENT:   Diagnoses / Active Problems: Limited information for diagnosis at this time Schizoaffective disorder, unspecified type (r/o schizophrenia) R/o Stimulant (cocaine type) and cannabis use disorders  PLAN: Safety and Monitoring:             --  Voluntary admission to inpatient psychiatric unit for safety, stabilization and treatment             -- Daily contact with patient to assess and evaluate symptoms and progress in treatment             -- Patient's case to be discussed in multi-disciplinary team meeting             -- Observation Level : q15 minute checks             -- Vital signs:  q12 hours             -- Precautions: suicide, elopement, and assault   2. Psychiatric Diagnoses and Treatment:  Schizoaffective disorder, unspecified type  -- Increase risperidone to 0.5 mg AM / 0.5 mg PM / 1 mg QHS for psychosis; plan for 1 mg BID 7/11             -- Discontinue home Cogentin 0.5 mg daily, continue PRN  -- Continue melatonin 3mg  qhs -- The risks/benefits/side-effects/alternatives to this medication were discussed in detail with the patient and time was given for questions. The patient consents to medication trial.              -- Metabolic profile and EKG monitoring obtained while on an atypical antipsychotic (BMI: 27 Lipid Panel: WNL HbgA1c: WNL QTc: 442)              -- Encouraged patient to participate in unit milieu and in scheduled group therapies              -- Agitation protocol with Haldol 5 mg PO/IM and  Cogentin PO/IM and add Ativan 1mg  po and IM for agitation PRN           -- continue MVI and thiamine oral replacement and add Ensure - will attempt to have staff offer snacks, drinks, and meal options that he can open himself due to paranoia with food               R/o Stimulant (cocaine type) and cannabis use disorders (UDS positive for cocaine and THC)  -- monitoring for signs of withdrawal  -- will need meaningful discussion about abstinence from substance use as he clears  -- Continue CIWA monitoring in event he is not reporting alcohol use prior to admission (recent scores 0,3,4)  3. Medical Issues Being Addressed:              Tobacco Use Disorder             -- Nicotine patch 21mg /24 hours ordered             -- Smoking cessation encouraged              Reported h/o pulmonary  embolus per patient report             -- Continue home Xarelto 20mg  daily  -- Patient states he "had a clot on my lungs" - will attempt to clarify with PCP and sister   Glaucoma  -- Continue home Cosopt drops   Hematuria  -- Noted by patient and confirmed on UA  -- Repeat UA, consult Urology if persistent (patient is an elderly  smoker and is at risk for bladder cancer)   4. Discharge Planning:              -- Social work and case management to assist with discharge planning and identification of hospital follow-up needs prior to discharge             -- Estimated LOS: 7-12 days             -- Discharge Concerns: Need to establish a safety plan; Medication compliance and effectiveness             -- Discharge Goals: Return home with outpatient referrals for mental health follow-up including medication management/psychotherapy   , MD 02/02/2022, 1:50 PM

## 2022-02-02 NOTE — Plan of Care (Signed)
  Problem: Coping: Goal: Ability to verbalize frustrations and anger appropriately will improve Outcome: Progressing Goal: Ability to demonstrate self-control will improve Outcome: Progressing   Problem: Activity: Goal: Interest or engagement in activities will improve Outcome: Progressing Goal: Sleeping patterns will improve Outcome: Progressing   Problem: Safety: Goal: Periods of time without injury will increase Outcome: Progressing   

## 2022-02-02 NOTE — Progress Notes (Signed)
   02/02/22 0800  Psych Admission Type (Psych Patients Only)  Admission Status Voluntary  Psychosocial Assessment  Patient Complaints Insomnia;Anxiety  Eye Contact Fair  Facial Expression Anxious  Affect Anxious  Speech Logical/coherent  Interaction Cautious;Guarded  Motor Activity Slow  Appearance/Hygiene Disheveled  Behavior Characteristics Cooperative  Mood Anxious  Thought Process  Coherency WDL  Content WDL  Delusions None reported or observed  Perception Hallucinations  Hallucination Auditory;Command  Judgment Impaired  Confusion None  Danger to Self  Current suicidal ideation? Passive  Self-Injurious Behavior No self-injurious ideation or behavior indicators observed or expressed   Agreement Not to Harm Self Yes  Description of Agreement Verbal  Danger to Others  Danger to Others None reported or observed

## 2022-02-02 NOTE — Plan of Care (Cosign Needed)
With patient's consent, contacted his brother [name] at [phone].  The patient's brother reports that the patient is "a drifter" and also says "you will have a hard time getting the truth out of Harry Jones".  He reports talking to his brother only sporadically over the past several years.  He states that the patient is originally from [place].  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  He feels the patient may have done hard drugs along the way.  He believes the patient was married at some point.  He reports the patient had some housing over the past several weeks but feels that he might have gotten "kicked out".  He is unable to state where he had housing.  He states that he will come tomorrow to visit Harry Jones.  Carlyn Reichert, MD PGY-2

## 2022-02-02 NOTE — Progress Notes (Signed)
Adult Psychoeducational Group Note  Date:  02/02/2022 Time:  9:00 PM  Group Topic/Focus:  Wrap-Up Group:   The focus of this group is to help patients review their daily goal of treatment and discuss progress on daily workbooks.  Participation Level:  Active  Participation Quality:  Appropriate  Affect:  Appropriate  Cognitive:  Appropriate  Insight: Appropriate  Engagement in Group:  Developing/Improving  Modes of Intervention:  Discussion  Additional Comments:  Pt stated his goal for today was to focus on his treatment plan and discuss his aftercare plan with his doctor. Pt stated he accomplished his goals today. Pt stated he talked with his doctor and social worker about his care today. Pt rated his overall day a 10.  Pt stated he made no calls today. Pt stated he felt better about himself today. Pt stated he was able to attend all meals. Pt stated he took all medications provided today. Pt stated he attend all groups held today. Pt stated his appetite was good today. Pt rated sleep last night was fair. Pt stated the goal tonight was to get some rest. Pt stated he had no physical pain tonight. Pt deny visual hallucinations and auditory issues tonight. Pt denies thoughts of harming himself or others. Pt stated he would alert staff if anything changed   [name] 02/02/2022, 9:00 PM

## 2022-02-02 NOTE — Group Note (Signed)
Recreation Therapy Group Note   Group Topic:Coping Skills  Group Date: 02/02/2022 Start Time: 0955 End Time: 1030 Facilitators: [name], LRT,CTRS Location: [address] Dayroom  Goal Area(s) Addresses:  Patient will identify positive coping skills. Patient will identify benefits of using coping skills post d/c.  Group Description:  Mind Map.  Patient was provided a blank template of a diagram with 32 blank boxes in a tiered system, branching from the center (similar to a bubble chart). LRT directed patients to label the middle of the diagram "Coping Skills".  LRT and patients then identified 8 different challenges (anxiety, depression, setting boundaries, relationships, attitude, self esteem, self respect and communication) in which coping skills could be used.  Patients were then given 20 minutes the come up with 3 coping skills for each challenge identified.  LRT would then write the coping skills the patients came up with on the board so patients could fill in any blank spots they may have had on their sheets.    Affect/Mood: N/A   Participation Level: Did not attend    Clinical Observations/Individualized Feedback:     Plan: Continue to engage patient in RT group sessions 2-3x/week.   [name], LRT,CTRS 02/02/2022 1:12 PM

## 2022-02-02 NOTE — Progress Notes (Signed)
Harry Jones complained of his urine smelling strong and appears to have blood in it.  Notified Shalon NP and UA was ordered.  Sample obtained.  He denied any urinary symptoms.  Nicotine patch removed before bedtime to help with nightmares.  He took his hs medications without difficulty.  He is currently resting with his eyes closed and appears to be asleep.      02/01/22 2058  Psych Admission Type (Psych Patients Only)  Admission Status Voluntary  Psychosocial Assessment  Patient Complaints Insomnia;Anxiety  Eye Contact Fair  Facial Expression Anxious  Affect Anxious  Speech Logical/coherent  Interaction Cautious;Guarded  Motor Activity Slow  Appearance/Hygiene Disheveled  Behavior Characteristics Cooperative  Mood Anxious;Depressed;Suspicious  Thought Process  Coherency WDL  Content WDL  Delusions None reported or observed  Perception Hallucinations  Hallucination Auditory;Command  Judgment Impaired  Confusion None  Danger to Self  Current suicidal ideation? Passive  Self-Injurious Behavior No self-injurious ideation or behavior indicators observed or expressed   Agreement Not to Harm Self Yes  Description of Agreement Verbal  Danger to Others  Danger to Others None reported or observed

## 2022-02-02 NOTE — Progress Notes (Signed)
    02/02/22 2039  Psych Admission Type (Psych Patients Only)  Admission Status Voluntary  Psychosocial Assessment  Patient Complaints Insomnia  Eye Contact Fair  Facial Expression Animated  Affect Appropriate to circumstance  Speech Logical/coherent  Interaction Assertive  Motor Activity Other (Comment) (Unremarkable)  Appearance/Hygiene Disheveled  Behavior Characteristics Cooperative;Appropriate to situation  Mood Pleasant  Thought Process  Coherency WDL  Content WDL  Delusions None reported or observed  Perception Hallucinations  Hallucination Visual  Judgment Impaired  Confusion None  Danger to Self  Current suicidal ideation? Passive  Self-Injurious Behavior No self-injurious ideation or behavior indicators observed or expressed   Agreement Not to Harm Self Yes  Description of Agreement Verbal  Danger to Others  Danger to Others None reported or observed

## 2022-02-02 NOTE — Group Note (Signed)
LCSW Group Therapy Note   Group Date: 02/02/2022 Start Time: 1300 End Time: 1400   Type of Therapy and Topic:  Group Therapy: Boundaries  Participation Level:  Minimal  Description of Group: This group will address the use of boundaries in their personal lives. Patients will explore why boundaries are important, the difference between healthy and unhealthy boundaries, and negative and postive outcomes of different boundaries and will look at how boundaries can be crossed.  Patients will be encouraged to identify current boundaries in their own lives and identify what kind of boundary is being set. Facilitators will guide patients in utilizing problem-solving interventions to address and correct types boundaries being used and to address when no boundary is being used. Understanding and applying boundaries will be explored and addressed for obtaining and maintaining a balanced life. Patients will be encouraged to explore ways to assertively make their boundaries and needs known to significant others in their lives, using other group members and facilitator for role play, support, and feedback.  Therapeutic Goals:  1.  Patient will identify areas in their life where setting clear boundaries could be  used to improve their life.  2.  Patient will identify signs/triggers that a boundary is not being respected. 3.  Patient will identify two ways to set boundaries in order to achieve balance in  their lives: 4.  Patient will demonstrate ability to communicate their needs and set boundaries  through discussion and/or role plays  Summary of Patient Progress:  Harry Jones was present/active throughout the session and proved open to feedback from CSW and peers. Patient demonstrated fair insight into the subject matter, was respectful of peers, and was present throughout the entire session.  Therapeutic Modalities:   Cognitive Behavioral Therapy Solution-Focused Therapy  Beatris Si,  LCSWA 02/02/2022  2:34 PM

## 2022-02-03 LAB — URINALYSIS, COMPLETE (UACMP) WITH MICROSCOPIC
Bilirubin Urine: NEGATIVE
Glucose, UA: NEGATIVE mg/dL
Ketones, ur: NEGATIVE mg/dL
Leukocytes,Ua: NEGATIVE
Nitrite: NEGATIVE
Protein, ur: 100 mg/dL — AB
RBC / HPF: >50 RBC/hpf — ABNORMAL HIGH (ref 0–5)
Specific Gravity, Urine: 1.024 (ref 1.005–1.030)
pH: 5 (ref 5.0–8.0)

## 2022-02-03 NOTE — BHH Counselor (Signed)
CSW contacted several sober living housing options on his behalf such [address], [address], [address] and [facility].  CSW left messages for [employer], [employer] and [employer].  CSW was able to discuss intake and admission process with Winferd Humphrey at [facility].  Winferd Humphrey requested a phone call from Patient and stated that a $300 enrollment fee was required along with either $535 month or $135 weekly. Winferd Humphrey also stated that a screener was required prior to admission to ensure suitable fit. CSW informed Patient, he appeared excited until discussion of payment however patient agreed to Screener.  Nolon Rod, LCSW (training) Clincial Social Worker [facility]   [place], [place], LCAS Clincal Social Worker  [facility]

## 2022-02-03 NOTE — Progress Notes (Signed)
Patient did not attended morning orientation group . 

## 2022-02-03 NOTE — Group Note (Signed)
Recreation Therapy Group Note   Group Topic:Self-Esteem  Group Date: 02/03/2022 Start Time: 1000 End Time: 1030 Facilitators: Bjorn Loser, NT Location: [address] Dayroom   Goal Area(s) Addresses:  Patient will be able to identify self esteem. Patient will successfully share why it is beneficial to positive self esteem.  Group Description:  Patients and LRT discussed the importance of self esteem and what influences how we see ourselves.  Patients then picked a picture of a blank face of their choosing.  Patients were instructed to draw and describe how they see themselves on the blank face.  On the back of the sheet, patients were to identify how other people perceive them.  Patients were given colored pencils, markers, and crayons to complete the assignment. Patients shared their completed assignment with each other. Writer and group reflected on how it's important to have positive thoughts about oneself because it can impact how you move in the world.   Affect/Mood: N/A   Participation Level: Did not attend    Clinical Observations/Individualized Feedback:     Plan: Continue to engage patient in RT group sessions 2-3x/week.   [name], Antonietta Jewel 02/03/2022 12:45 PM

## 2022-02-03 NOTE — H&P (Signed)
Patient did not attended morning orientation group .

## 2022-02-03 NOTE — Progress Notes (Signed)
[facility] MD Progress Note  02/03/2022 2:53 PM Harry Jones  MRN:  [id number] Subjective:    Harry Jones is a 66 year old male with a self-reported past psychiatric history of schizophrenia, bipolar disorder, and depression.  There is very little information available about this patient in the chart previous to 2 days ago.  He was transferred from the behavioral health urgent care to the [facility] behavioral health hospital due to suicidal thoughts with a plan to walk into traffic.  He has also been reporting command auditory hallucinations.  He is admitted on a voluntary basis.  Yesterday, the following recommendations were made:              -- Increase Risperidone to 1 mg BID for psychosis  Interval History: PRN Medications administered within the last 24 hours: none Per nursing staff: participated well in group   Per Patient:  On assessment today, the patient is found staring at the wall in his room, singing to himself. He is verbally expressive today and says that he is "happy".  When asked why he feels happy, the patient reports "I want to live for Christ".  He talks about his desire to be successful in the future and "speak to people at homeless shelters about what I have overcome".  He reports that his command auditory hallucinations are present but are decreasing in intensity.  He reports that these voices still tell him "I am not getting any better".  He reports that his sleep is still been disrupted by his command auditory hallucinations.  He reports his energy has been doing better.  When asked about his appetite, the patient reports that he went to the cafeteria for the first time yesterday and ate some food.  The patient denies experiencing any paranoia with the food presently.  The patient reports resolution of his hematuria with drinking more fluids.  He denies suicidal or homicidal thoughts today.  Patient denies side effects to current scheduled psychiatric medications.   Patient denies  other somatic complaints.   Principal Problem: Schizoaffective disorder (HCC) Diagnosis: Principal Problem:   Schizoaffective disorder (HCC) Active Problems:   Homelessness   Suicidal ideation  Total Time spent with patient: 15 minutes  Past Psychiatric History: schizophrenia per patient report  Past Medical History: No past medical history on file. No past surgical history on file. Family History: No family history on file. Family Psychiatric  History: unknown Social History:  Social History   Substance and Sexual Activity  Alcohol Use None     Social History   Substance and Sexual Activity  Drug Use Not on file    Social History   Socioeconomic History   Marital status: Widowed    Spouse name: Not on file   Number of children: Not on file   Years of education: Not on file   Highest education level: Not on file  Occupational History   Not on file  Tobacco Use   Smoking status: Every Day    Packs/day: 1.00    Types: Cigarettes   Smokeless tobacco: Never  Substance and Sexual Activity   Alcohol use: Not on file   Drug use: Not on file   Sexual activity: Not on file  Other Topics Concern   Not on file  Social History Narrative   Per chart review & Nursing report: Pt is homeless lives under a bridge. Wife died 7 years ago.   Social Determinants of Health   Financial Resource Strain: Not on file  Food Insecurity: Not on file  Transportation Needs: Not on file  Physical Activity: Not on file  Stress: Not on file  Social Connections: Not on file   Additional Social History:       Sleep: Fair  Appetite:  Fair  Current Medications: Current Facility-Administered Medications  Medication Dose Route Frequency Provider Last Rate Last Admin   acetaminophen (TYLENOL) tablet 650 mg  650 mg Oral Q6H PRN Merrily Brittle, DO       alum & mag hydroxide-simeth (MAALOX/MYLANTA) 200-200-20 MG/5ML suspension 30 mL  30 mL Oral Q4H PRN Merrily Brittle, DO       benztropine  (COGENTIN) tablet 1 mg  1 mg Oral BID PRN Harlow Asa, MD       Or   benztropine mesylate (COGENTIN) injection 1 mg  1 mg Intramuscular BID PRN Harlow Asa, MD       dorzolamide-timolol (COSOPT) 22.3-6.8 MG/ML ophthalmic solution 1 drop  1 drop Both Eyes BID Merrily Brittle, DO   1 drop at 02/03/22 0844   feeding supplement (ENSURE ENLIVE / ENSURE PLUS) liquid 237 mL  237 mL Oral BID BM Nelda Marseille, Amy E, MD   237 mL at 02/02/22 1346   haloperidol (HALDOL) tablet 5 mg  5 mg Oral Q8H PRN Corky Sox, MD       Or   haloperidol lactate (HALDOL) injection 5 mg  5 mg Intramuscular Q8H PRN Corky Sox, MD       hydrOXYzine (ATARAX) tablet 25 mg  25 mg Oral TID PRN Ajibola, Ene A, NP   25 mg at 02/01/22 2058   LORazepam (ATIVAN) tablet 1 mg  1 mg Oral Q6H PRN Harlow Asa, MD       Or   LORazepam (ATIVAN) injection 1 mg  1 mg Intramuscular Q6H PRN Nelda Marseille, Amy E, MD       magnesium hydroxide (MILK OF MAGNESIA) suspension 30 mL  30 mL Oral Daily PRN Merrily Brittle, DO       melatonin tablet 3 mg  3 mg Oral QHS Merrily Brittle, DO   3 mg at 02/02/22 2039   multivitamin with minerals tablet 1 tablet  1 tablet Oral Daily Merrily Brittle, DO   1 tablet at 02/03/22 0844   nicotine (NICODERM CQ - dosed in mg/24 hours) patch 21 mg  21 mg Transdermal Daily Corky Sox, MD   21 mg at 02/03/22 0844   risperiDONE (RISPERDAL) tablet 1 mg  1 mg Oral QHS Corky Sox, MD   1 mg at 02/02/22 2039   risperiDONE (RISPERDAL) tablet 1 mg  1 mg Oral Daily Corky Sox, MD   1 mg at 02/03/22 0844   rivaroxaban (XARELTO) tablet 20 mg  20 mg Oral Daily Merrily Brittle, DO   20 mg at 02/03/22 0844   thiamine tablet 100 mg  100 mg Oral Daily Merrily Brittle, DO   100 mg at 02/03/22 [id number]    Lab Results:  Results for orders placed or performed during the hospital encounter of 01/28/22 (from the past 48 hour(s))  Urinalysis, Routine w reflex microscopic Urine, Clean Catch     Status: Abnormal   Collection  Time: 02/01/22  9:18 PM  Result Value Ref Range   Color, Urine YELLOW YELLOW   APPearance CLOUDY (A) CLEAR   Specific Gravity, Urine 1.024 1.005 - 1.030   pH 6.0 5.0 - 8.0   Glucose, UA NEGATIVE NEGATIVE mg/dL   Hgb urine dipstick LARGE (A) NEGATIVE  Bilirubin Urine NEGATIVE NEGATIVE   Ketones, ur NEGATIVE NEGATIVE mg/dL   Protein, ur 100 (A) NEGATIVE mg/dL   Nitrite NEGATIVE NEGATIVE   Leukocytes,Ua NEGATIVE NEGATIVE   RBC / HPF >50 (H) 0 - 5 RBC/hpf   WBC, UA 6-10 0 - 5 WBC/hpf   Bacteria, UA FEW (A) NONE SEEN    Comment: Performed at [facility], [place] [address], [place], [place] [address]  Urinalysis, Complete w Microscopic Urine, Clean Catch     Status: Abnormal   Collection Time: 02/02/22  4:30 PM  Result Value Ref Range   Color, Urine AMBER (A) YELLOW    Comment: BIOCHEMICALS MAY BE AFFECTED BY COLOR   APPearance CLOUDY (A) CLEAR   Specific Gravity, Urine 1.023 1.005 - 1.030   pH 7.0 5.0 - 8.0   Glucose, UA NEGATIVE NEGATIVE mg/dL   Hgb urine dipstick LARGE (A) NEGATIVE   Bilirubin Urine NEGATIVE NEGATIVE   Ketones, ur NEGATIVE NEGATIVE mg/dL   Protein, ur 30 (A) NEGATIVE mg/dL   Nitrite NEGATIVE NEGATIVE   Leukocytes,Ua TRACE (A) NEGATIVE   RBC / HPF >50 (H) 0 - 5 RBC/hpf   WBC, UA 6-10 0 - 5 WBC/hpf   Bacteria, UA RARE (A) NONE SEEN    Comment: Performed at [facility], [place] [address], [place], [place] [address]     Blood Alcohol level:  Lab Results  Component Value Date   ETH <10 [id number]    Metabolic Disorder Labs: Lab Results  Component Value Date   HGBA1C 5.5 01/27/2022   MPG 111.15 01/27/2022   No results found for: "PROLACTIN" Lab Results  Component Value Date   CHOL 187 01/27/2022   TRIG 46 01/27/2022   HDL 79 01/27/2022   CHOLHDL 2.4 01/27/2022   VLDL 9 01/27/2022   LDLCALC 99 01/27/2022    Physical Findings: -AIMS: 0 (assessed 7/11) -No orofacial movements noted -No cogwheeling or rigidity, no  tremor or bradykinesia  CIWA:  CIWA-Ar Total: 0  Musculoskeletal: Strength & Muscle Tone: within normal limits Gait & Station: normal Patient leans: N/A  Psychiatric Specialty Exam:  Presentation  General Appearance: Disheveled  Eye Contact:Fair  Speech:Clear and Coherent  Speech Volume:Decreased  Handedness:Right   Mood and Affect  Mood: "happy" Affect: euthymic at times but often constricted  Thought Process  Thought Processes: relatively linear, but concrete with no abstract thought Descriptions of Associations:Intact  Orientation:Full (Time, Place and Person)  Thought Content: he denies homicidal thoughts and suicidal thoughts; he reports command AH and VH of shadows; he is not grossly responding to internal/external stimuli on exam  History of Schizophrenia/Schizoaffective disorder:-- (unknown)  Duration of Psychotic Symptoms:-- (unknown)  Hallucinations: command auditory hallucinations decreasing in intensity Ideas of Reference: none reported today  Suicidal Thoughts: denies  Homicidal Thoughts: denies   Sensorium  Memory:Immediate Poor; Recent Poor; Remote Poor  Judgment:Poor  Insight:Poor   Executive Functions  Concentration:Poor  Attention Span:Poor  Recall:Poor  Fund of Knowledge:Poor  Language:Poor   Psychomotor Activity  Psychomotor Activity:No data recorded   Assets  Assets:Communication Skills; Desire for Improvement; Resilience; Leisure Time   Sleep  fair   Physical Exam Constitutional:      Appearance: the patient is not toxic-appearing.  Pulmonary:     Effort: Pulmonary effort is normal.  Neurological:     General: No focal deficit present.     Mental Status: the patient is alert and oriented to person, place, and time.   Review of Systems  Respiratory:  Negative for shortness of breath.   Cardiovascular:  Negative for chest pain.  Gastrointestinal:  Negative for abdominal pain, constipation, diarrhea, nausea  and vomiting.  Neurological:  Negative for headaches.   Blood pressure 98/75, pulse 86, temperature (!) 97.4 F (36.3 C), temperature source Oral, resp. rate 16, height 6\' 5"  (1.956 m), weight 102.1 kg, SpO2 100 %. Body mass index is 26.68 kg/m.   Treatment Plan Summary: Daily contact with patient to assess and evaluate symptoms and progress in treatment and Medication management  ASSESSMENT:   Diagnoses / Active Problems: Limited information for diagnosis at this time Schizoaffective disorder, unspecified type (r/o schizophrenia) R/o Stimulant (cocaine type) and cannabis use disorders  PLAN: Safety and Monitoring:             --  Voluntary admission to inpatient psychiatric unit for safety, stabilization and treatment             -- Daily contact with patient to assess and evaluate symptoms and progress in treatment             -- Patient's case to be discussed in multi-disciplinary team meeting             -- Observation Level : q15 minute checks             -- Vital signs:  q12 hours             -- Precautions: suicide, elopement, and assault   2. Psychiatric Diagnoses and Treatment:  Schizoaffective disorder, unspecified type  -- Continue Risperdal 1 mg BID 7/11             -- Discontinued home Cogentin 0.5 mg daily, continue PRN  -- Continue melatonin 3mg  qhs -- The risks/benefits/side-effects/alternatives to this medication were discussed in detail with the patient and time was given for questions. The patient consents to medication trial.              -- Metabolic profile and EKG monitoring obtained while on an atypical antipsychotic (BMI: 27 Lipid Panel: WNL HbgA1c: WNL QTc: 442)              -- Encouraged patient to participate in unit milieu and in scheduled group therapies              -- Agitation protocol with Haldol 5 mg PO/IM and Cogentin PO/IM and add Ativan 1mg  po and IM for agitation PRN           -- continue MVI and thiamine oral replacement and add Ensure -  will attempt to have staff offer snacks, drinks, and meal options that he can open himself due to paranoia with food               R/o Stimulant (cocaine type) and cannabis use disorders (UDS positive for cocaine and THC)  -- will need meaningful discussion about abstinence  3. 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 Discharge Planning:              -- Social work and case management to assist with discharge planning and identification of hospital follow-up needs prior to discharge             -- Estimated LOS: 7-12 days             -- Discharge Concerns: Need to establish a safety plan; Medication compliance and effectiveness             -- Discharge Goals: Return home with outpatient referrals for mental health follow-up including medication management/psychotherapy  --Patient is unlikely to qualify for residential substance use treatment given his insurance.  [facility] may be an option.  We will further discuss with the patient.   Carlyn Reichert, MD 02/03/2022, 2:53 PM

## 2022-02-03 NOTE — Progress Notes (Signed)
Adult Psychoeducational Group Note  Date:  02/03/2022 Time:  10:35 PM  Group Topic/Focus:  Wrap-Up Group:   The focus of this group is to help patients review their daily goal of treatment and discuss progress on daily workbooks.  Participation Level:  Active  Participation Quality:  Appropriate  Affect:  Appropriate  Cognitive:  Appropriate  Insight: Appropriate  Engagement in Group:  Developing/Improving  Modes of Intervention:  Discussion  Additional Comments:  Pt stated his goal for today was to focus on his treatment plan and discuss his aftercare plan with his Child psychotherapist. Pt stated he accomplished his goals today. Pt stated he talked with his doctor and social worker about his care today. Pt stated he was able to get housing set up for after discharge. Pt rated his overall day a 4 out 10.  Pt stated he was able to make some calls today for housing. Pt stated he felt better about himself today. Pt stated he was able to attend all meals. Pt stated he took all medications provided today. Pt stated he attend all groups held today. Pt stated his appetite was fair today. Pt rated sleep last night was fair. Pt stated the goal tonight was to get some rest. Pt stated he had no physical pain tonight. Pt deny visual hallucinations and auditory issues tonight. Pt denies thoughts of harming himself or others. Pt stated he would alert staff if anything changed.  [name] 02/03/2022, 10:35 PM

## 2022-02-03 NOTE — Progress Notes (Signed)
   02/03/22 1945  Psych Admission Type (Psych Patients Only)  Admission Status Voluntary  Psychosocial Assessment  Patient Complaints Depression  Eye Contact Fair  Facial Expression Animated  Affect Appropriate to circumstance;Anxious  Speech Logical/coherent  Interaction Assertive  Motor Activity Slow  Appearance/Hygiene Disheveled  Behavior Characteristics Cooperative;Anxious  Mood Anxious;Pleasant  Thought Process  Coherency WDL  Content WDL  Delusions None reported or observed  Perception Hallucinations  Hallucination Auditory (doesn't like to talk about them)  Judgment Impaired  Confusion None  Danger to Self  Current suicidal ideation? Denies  Danger to Others  Danger to Others None reported or observed   Progress note   D: Pt seen in dayroom. Pt denies SI, HI, VH. Pt endorses AH. Pt doesn't want to talk about them. Says that when he thinks about them, they are there. Pt rates pain  0/10. Pt rates anxiety 0 /10 and depression  4/10. Pt very animated, assertive and more open. Socializing in dayroom with peers and speaking with staff. Pt says one good thing about his day is that he talked to a guy who was offering him a place to live. "I'm also just glad to be alive." Pt has been attending groups. Says sleep is fair. "I went down to dinner because they told me they would have chicken. It wasn't regular chicken it was a patty. They fooled me." Pt has been out more than before. Pt had a lively conversation with this Clinical research associate. No other complaints noted.  A: Pt provided support and encouragement. Pt given scheduled medication as prescribed. PRNs as appropriate. Pt asked for PRN Vistaril. Pt also removed nicotine patch to avoid possible vivid dreams from wearing it to bed. Q15 min checks for safety.   R: Pt safe on the unit. Will continue to monitor.

## 2022-02-03 NOTE — BHH Counselor (Signed)
CSW provided patient a list of sober living resources including [employer], [address], [facility] and [facility] [place].  CSW also role played with patient about how to participate in phone calls to these places and informed patient that these places usually require a small fee and for him to be sober while living there.  Patient demonstrated understanding. Patient discussed that he may return back to living under a bridge and wanted to know how much longer he could stay at [facility].  CSW let patient know that that was between him and his doctor when they felt he was psychiatrically stable.    Javiel Canepa, LCSW, LCAS Clincal Social Worker  [facility]

## 2022-02-03 NOTE — Progress Notes (Signed)
Called and discussed case with Dr. Marlou Porch of Urology.  He feels the most likely cause of the patient's symptoms is UTI causing hematuria, but does feel bladder cancer is a concern and needs to be ruled out. He recommends CT A/P w/wo on an outpatient basis as the next step. He reports patient has been seen by [facility] previously and that they will schedule follow up for him.   Will obtain urine culture per recommendation. Of note, patient states his hematuria has resolved with increased fluid intake.    Carlyn Reichert, MD PGY-2

## 2022-02-03 NOTE — Progress Notes (Signed)
   02/03/22 1000  Psych Admission Type (Psych Patients Only)  Admission Status Voluntary  Psychosocial Assessment  Patient Complaints Anxiety  Eye Contact Fair  Facial Expression Animated  Affect Appropriate to circumstance  Speech Logical/coherent  Interaction Assertive  Motor Activity Slow  Appearance/Hygiene Disheveled  Behavior Characteristics Cooperative  Mood Anxious  Thought Process  Coherency WDL  Content WDL  Delusions None reported or observed  Perception WDL  Hallucination None reported or observed  Judgment Impaired  Confusion None  Danger to Self  Current suicidal ideation? Denies  Danger to Others  Danger to Others None reported or observed

## 2022-02-04 ENCOUNTER — Encounter (HOSPITAL_COMMUNITY): Payer: Self-pay

## 2022-02-04 DIAGNOSIS — F259 Schizoaffective disorder, unspecified: Principal | ICD-10-CM

## 2022-02-04 LAB — URINE CULTURE: Culture: NO GROWTH

## 2022-02-04 MED ORDER — RISPERIDONE 1 MG PO TABS
1.0000 mg | ORAL_TABLET | ORAL | Status: DC
  Administered 2022-02-04 – 2022-02-05 (×2): 1 mg via ORAL
  Filled 2022-02-04 (×6): qty 1

## 2022-02-04 NOTE — Progress Notes (Signed)
   02/04/22 2045  Psych Admission Type (Psych Patients Only)  Admission Status Voluntary  Psychosocial Assessment  Patient Complaints Anxiety;Depression  Eye Contact Fair  Facial Expression Animated;Anxious  Affect Appropriate to circumstance  Speech Logical/coherent  Interaction Assertive  Motor Activity Slow  Appearance/Hygiene Unremarkable  Behavior Characteristics Cooperative;Appropriate to situation  Mood Anxious;Pleasant  Thought Process  Coherency WDL  Content WDL  Delusions None reported or observed  Perception Hallucinations  Hallucination Auditory;Command  Judgment Impaired  Confusion None  Danger to Self  Current suicidal ideation? Denies  Danger to Others  Danger to Others None reported or observed   Progress note   D: Pt seen in dayroom. Pt denies SI, HI, VH. Pt endorses command auditory hallucinations are still there. When he hears them, he rolls over and goes to sleep. Pt rates pain  4/10 in his right shoulder from an injection a few days ago. Pt refused any pain medicine. Pt rates anxiety  4/10 and depression  4/10. Pt says he is happy to be alive today. Pt is interacting more with staff and peers. Pt is less paranoid and guarded. No other concerns noted.  A: Pt provided support and encouragement. Pt given scheduled medication as prescribed. PRNs as appropriate. Q15 min checks for safety.   R: Pt safe on the unit. Will continue to monitor.

## 2022-02-04 NOTE — Progress Notes (Signed)
   02/04/22 0556  Sleep  Number of Hours 7

## 2022-02-04 NOTE — Progress Notes (Signed)
Pt visible in dayroom majority of this shift. Attended scheduled MHT and CSW groups but did not attend rec therapy group. Presents animated with logical speech, fair eye contact but anxious about placement issues. Denies SI, HI, AVH and pain when assessed "not right now". Stated improvement in auditory hallucinations "It's much better, I haven't heard anything yet this morning. Being around people help too". Remains medication compliant without adverse drug reactions. Emotional support, encouragement and reassurance offered to pt. Verbal education provided with scheduled medications and effects monitored. Safety checks maintained at Q 15 minutes intervals without outburst. Pt went off unit to courtyard for activities, cafeteria for meals and returned without issues.

## 2022-02-04 NOTE — Group Note (Signed)
  B[facility] LCSW Group Therapy Note  Date/Time:  02/04/2022 1:00pm  Type of Therapy and Topic:  Group Therapy:  Self-Care after Hospitalization  Participation Level:  Active   Description of Group This process group involved patients discussing how they plan to take care of themselves in a better manner when they get home from the hospital.  The group started with patients listing one healthy and one unhealthy way they took care of themselves prior to hospitalization.  A discussion ensued about the differences in healthy and unhealthy coping skills.  Group members shared ideas about making changes when they return home so that they can stay well and in recovery.  The white board was used to list ideas so that patients can continue to see these ideas throughout the day.  Therapeutic Goals Patient will identify and describe one healthy and one unhealthy coping technique used prior to hospitalization Patient will participate in generating ideas about healthy self-care options when they return to the community Patients will be supportive of one another and receive said support from others Patient will identify one healthy self-care activity to add to his/her post-hospitalization life that can help in recovery  Summary of Patient Progress:  The patient expressed that prior to hospitalization some healthy self-care activity playing chess, he engaged in, while unhealthy self-care activity included substance usage.  Patient's participation in group was active.   Patient identified playing jack rocks as another self-care activity to add in  pursuit of recovery post-discharge.   Therapeutic Modalities Brief Solution-Focused Therapy Motivational Interviewing Psychoeducation   Nolon Rod, LCSW 02/04/2022, 12:00pm

## 2022-02-04 NOTE — Progress Notes (Signed)
[facility] MD Progress Note  02/04/2022 8:26 PM EURA RADABAUGH  MRN:  [id number] Subjective:    Harry Jones is a 66 year old male with a self-reported past psychiatric history of schizophrenia, bipolar disorder, and depression.  There is very little information available about this patient in the chart previous to 2 days ago.  He was transferred from the behavioral health urgent care to the [facility] behavioral health hospital due to suicidal thoughts with a plan to walk into traffic.  He has also been reporting command auditory hallucinations.  He is admitted on a voluntary basis.  Yesterday, the following recommendations were made:              -- Continue risperidone 1 mg BID for psychosis  Interval History: PRN Medications administered within the last 24 hours: none Per nursing staff: participated well in group   Per Patient:  On assessment today, the patient reports that his mood is "good".  He reports that he has been attending groups and going to the cafeteria and is proud of himself for this.  Previously the patient had displayed severe paranoia regarding going to groups and the cafeteria.  He states that he is still experiencing auditory hallucinations saying "you are messing up".  He continues to report experiencing visual hallucinations of shadows.  He reports that his energy and sleep are both okay.  He denies suicidal or homicidal thoughts.  Patient denies side effects to current scheduled psychiatric medications.   Patient denies other somatic complaints.   Principal Problem: Schizoaffective disorder (HCC) Diagnosis: Principal Problem:   Schizoaffective disorder (HCC) Active Problems:   Homelessness   Suicidal ideation  Total Time spent with patient: 15 minutes  Past Psychiatric History: schizophrenia per patient report  Past Medical History: No past medical history on file. No past surgical history on file. Family History: No family history on file. Family Psychiatric  History:  unknown Social History:  Social History   Substance and Sexual Activity  Alcohol Use None     Social History   Substance and Sexual Activity  Drug Use Not on file    Social History   Socioeconomic History   Marital status: Widowed    Spouse name: Not on file   Number of children: Not on file   Years of education: Not on file   Highest education level: Not on file  Occupational History   Not on file  Tobacco Use   Smoking status: Every Day    Packs/day: 1.00    Types: Cigarettes   Smokeless tobacco: Never  Substance and Sexual Activity   Alcohol use: Not on file   Drug use: Not on file   Sexual activity: Not on file  Other Topics Concern   Not on file  Social History Narrative   Per chart review & Nursing report: Pt is homeless lives under a bridge. Wife died 7 years ago.   Social Determinants of Health   Financial Resource Strain: Not on file  Food Insecurity: Not on file  Transportation Needs: Not on file  Physical Activity: Not on file  Stress: Not on file  Social Connections: Not on file   Additional Social History:       Sleep: Fair  Appetite:  Fair  Current Medications: Current Facility-Administered Medications  Medication Dose Route Frequency Provider Last Rate Last Admin   acetaminophen (TYLENOL) tablet 650 mg  650 mg Oral Q6H PRN Princess Bruins, DO       alum & mag hydroxide-simeth (MAALOX/MYLANTA)  200-200-20 MG/5ML suspension 30 mL  30 mL Oral Q4H PRN Princess Bruins, DO       benztropine (COGENTIN) tablet 1 mg  1 mg Oral BID PRN Comer Locket, MD       Or   benztropine mesylate (COGENTIN) injection 1 mg  1 mg Intramuscular BID PRN Mason Jim, Amy E, MD       dorzolamide-timolol (COSOPT) 22.3-6.8 MG/ML ophthalmic solution 1 drop  1 drop Both Eyes BID Princess Bruins, DO   1 drop at 02/04/22 1653   feeding supplement (ENSURE ENLIVE / ENSURE PLUS) liquid 237 mL  237 mL Oral BID BM Mason Jim, Amy E, MD   237 mL at 02/04/22 1353   haloperidol (HALDOL)  tablet 5 mg  5 mg Oral Q8H PRN Carlyn Reichert, MD       Or   haloperidol lactate (HALDOL) injection 5 mg  5 mg Intramuscular Q8H PRN Carlyn Reichert, MD       hydrOXYzine (ATARAX) tablet 25 mg  25 mg Oral TID PRN Ajibola, Ene A, NP   25 mg at 02/03/22 2045   LORazepam (ATIVAN) tablet 1 mg  1 mg Oral Q6H PRN Comer Locket, MD       Or   LORazepam (ATIVAN) injection 1 mg  1 mg Intramuscular Q6H PRN Mason Jim, Amy E, MD       magnesium hydroxide (MILK OF MAGNESIA) suspension 30 mL  30 mL Oral Daily PRN Princess Bruins, DO       melatonin tablet 3 mg  3 mg Oral QHS Princess Bruins, DO   3 mg at 02/03/22 2045   multivitamin with minerals tablet 1 tablet  1 tablet Oral Daily Princess Bruins, DO   1 tablet at 02/04/22 6269   nicotine (NICODERM CQ - dosed in mg/24 hours) patch 21 mg  21 mg Transdermal Daily Carlyn Reichert, MD   21 mg at 02/04/22 0827   risperiDONE (RISPERDAL) tablet 1 mg  1 mg Oral BH-qamhs Massengill, Nathan, MD       rivaroxaban Carlena Hurl) tablet 20 mg  20 mg Oral Daily Princess Bruins, DO   20 mg at 02/04/22 4854   thiamine tablet 100 mg  100 mg Oral Daily Princess Bruins, DO   100 mg at 02/04/22 6270    Lab Results:  Results for orders placed or performed during the hospital encounter of 01/28/22 (from the past 48 hour(s))  Urine Culture     Status: None   Collection Time: 02/03/22  5:12 PM   Specimen: Urine, Clean Catch  Result Value Ref Range   Specimen Description      URINE, CLEAN CATCH Performed at [facility], [address], [place], [place] [address]    Special Requests      NONE Performed at [facility], [address], [place], [place] [address]    Culture      NO GROWTH Performed at [facility], [address], [place], [place] [address]    Report Status 02/04/2022 FINAL   Urinalysis, Complete w Microscopic Urine, Clean Catch     Status: Abnormal   Collection Time: 02/03/22  5:12 PM  Result Value Ref Range   Color,  Urine AMBER (A) YELLOW    Comment: BIOCHEMICALS MAY BE AFFECTED BY COLOR   APPearance CLOUDY (A) CLEAR   Specific Gravity, Urine 1.024 1.005 - 1.030   pH 5.0 5.0 - 8.0   Glucose, UA NEGATIVE NEGATIVE mg/dL   Hgb urine  dipstick LARGE (A) NEGATIVE   Bilirubin Urine NEGATIVE NEGATIVE   Ketones, ur NEGATIVE NEGATIVE mg/dL   Protein, ur 100 (A) NEGATIVE mg/dL   Nitrite NEGATIVE NEGATIVE   Leukocytes,Ua NEGATIVE NEGATIVE   RBC / HPF >50 (H) 0 - 5 RBC/hpf   WBC, UA 11-20 0 - 5 WBC/hpf   Bacteria, UA RARE (A) NONE SEEN   Mucus PRESENT     Comment: Performed at [facility], [place] [address] [place], [place] [address]     Blood Alcohol level:  Lab Results  Component Value Date   ETH <10 [id number]    Metabolic Disorder Labs: Lab Results  Component Value Date   HGBA1C 5.5 01/27/2022   MPG 111.15 01/27/2022   No results found for: "PROLACTIN" Lab Results  Component Value Date   CHOL 187 01/27/2022   TRIG 46 01/27/2022   HDL 79 01/27/2022   CHOLHDL 2.4 01/27/2022   VLDL 9 01/27/2022   LDLCALC 99 01/27/2022    Physical Findings: -AIMS: 0 (assessed 7/11) -No orofacial movements noted -No cogwheeling or rigidity, no tremor or bradykinesia  CIWA:  CIWA-Ar Total: 0  Musculoskeletal: Strength & Muscle Tone: within normal limits Gait & Station: normal Patient leans: N/A  Psychiatric Specialty Exam:  Presentation  General Appearance: Disheveled  Eye Contact:Fair  Speech:Clear and Coherent  Speech Volume:Decreased  Handedness:Right   Mood and Affect  Mood: "good" Affect: euthymic at times but often constricted  Thought Process  Thought Processes: relatively linear, but concrete with no abstract thought Descriptions of Associations:Intact  Orientation:Full (Time, Place and Person)  Thought Content: he denies homicidal thoughts and suicidal thoughts; he reports command AH and VH of shadows; he is not grossly responding to internal/external  stimuli on exam  History of Schizophrenia/Schizoaffective disorder:-- (unknown)  Duration of Psychotic Symptoms:-- (unknown)  Hallucinations: auditory hallucinations decreasing in intensity Ideas of Reference: none reported today  Suicidal Thoughts: denies  Homicidal Thoughts: denies   Sensorium  Memory:Immediate Poor; Recent Poor; Remote Poor  Judgment:Poor  Insight:Poor   Executive Functions  Concentration:Poor  Attention Span:Poor  Recall:Poor  Fund of Knowledge:Poor  Language:Poor   Psychomotor Activity  Psychomotor Activity:No data recorded   Assets  Assets:Communication Skills; Desire for Improvement; Resilience; Leisure Time   Sleep  fair   Physical Exam Constitutional:      Appearance: the patient is not toxic-appearing.  Pulmonary:     Effort: Pulmonary effort is normal.  Neurological:     General: No focal deficit present.     Mental Status: the patient is alert and oriented to person, place, and time.   Review of Systems  Respiratory:  Negative for shortness of breath.   Cardiovascular:  Negative for chest pain.  Gastrointestinal:  Negative for abdominal pain, constipation, diarrhea, nausea and vomiting.  Neurological:  Negative for headaches.   Blood pressure 107/73, pulse 82, temperature 98.4 F (36.9 C), temperature source Oral, resp. rate 16, height 6\' 5"  (1.956 m), weight 102.1 kg, SpO2 100 %. Body mass index is 26.68 kg/m.   Treatment Plan Summary: Daily contact with patient to assess and evaluate symptoms and progress in treatment and Medication management  ASSESSMENT:   Diagnoses / Active Problems: Limited information for diagnosis at this time Schizoaffective disorder, unspecified type (r/o schizophrenia) R/o Stimulant (cocaine type) and cannabis use disorders  PLAN: Safety and Monitoring:             --  Voluntary admission to inpatient psychiatric unit for safety, stabilization and treatment             --  Daily  contact with patient to assess and evaluate symptoms and progress in treatment             -- Patient's case to be discussed in multi-disciplinary team meeting             -- Observation Level : q15 minute checks             -- Vital signs:  q12 hours             -- Precautions: suicide, elopement, and assault   2. Psychiatric Diagnoses and Treatment:  Schizoaffective disorder, unspecified type  -- Continue Risperdal 1 mg BID 7/11             -- Discontinued home Cogentin 0.5 mg daily, continue PRN  -- Continue melatonin 3mg  qhs -- The risks/benefits/side-effects/alternatives to this medication were discussed in detail with the patient and time was given for questions. The patient consents to medication trial.              -- Metabolic profile and EKG monitoring obtained while on an atypical antipsychotic (BMI: 27 Lipid Panel: WNL HbgA1c: WNL QTc: 442)              -- Encouraged patient to participate in unit milieu and in scheduled group therapies              -- Agitation protocol with Haldol 5 mg PO/IM and Cogentin PO/IM and add Ativan 1mg  po and IM for agitation PRN           -- continue MVI and thiamine oral replacement and add Ensure - will attempt to have staff offer snacks, drinks, and meal options that he can open himself due to paranoia with food               R/o Stimulant (cocaine type) and cannabis use disorders (UDS positive for cocaine and THC)  -- will need meaningful discussion about abstinence  3. 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 Discharge Planning:              --  Social work and case management to assist with discharge planning and identification of hospital follow-up needs prior to discharge             -- Estimated LOS: 7-12 days             -- Discharge Concerns: Need to establish a safety plan; Medication compliance and effectiveness             -- Discharge Goals: Return home with outpatient referrals for mental health follow-up including medication management/psychotherapy   Corky Sox, MD 02/04/2022, 8:26 PM

## 2022-02-04 NOTE — BHH Group Notes (Signed)
Adult Psychoeducational Group Note  Date:  02/04/2022 Time:  9:03 AM  Group Topic/Focus:  Goals Group:   The focus of this group is to help patients establish daily goals to achieve during treatment and discuss how the patient can incorporate goal setting into their daily lives to aide in recovery.  Participation Level:  Active  Participation Quality:  Attentive  Affect:  Appropriate  Cognitive:  Appropriate  Insight: Improving  Engagement in Group:  Engaged  Modes of Intervention:  Discussion  Additional Comments:    Donell Beers 02/04/2022, 9:03 AM

## 2022-02-04 NOTE — BH IP Treatment Plan (Signed)
Interdisciplinary Treatment and Diagnostic Plan Update  02/04/2022 Time of Session: 10:15am  Harry Jones MRN: [id number]  Principal Diagnosis: Schizoaffective disorder [facility])  Secondary Diagnoses: Principal Problem:   Schizoaffective disorder (HCC) Active Problems:   Homelessness   Suicidal ideation   Current Medications:  Current Facility-Administered Medications  Medication Dose Route Frequency Provider Last Rate Last Admin   acetaminophen (TYLENOL) tablet 650 mg  650 mg Oral Q6H PRN Princess Bruins, DO       alum & mag hydroxide-simeth (MAALOX/MYLANTA) 200-200-20 MG/5ML suspension 30 mL  30 mL Oral Q4H PRN Princess Bruins, DO       benztropine (COGENTIN) tablet 1 mg  1 mg Oral BID PRN Comer Locket, MD       Or   benztropine mesylate (COGENTIN) injection 1 mg  1 mg Intramuscular BID PRN Comer Locket, MD       dorzolamide-timolol (COSOPT) 22.3-6.8 MG/ML ophthalmic solution 1 drop  1 drop Both Eyes BID Princess Bruins, DO   1 drop at 02/04/22 0825   feeding supplement (ENSURE ENLIVE / ENSURE PLUS) liquid 237 mL  237 mL Oral BID BM Mason Jim, Amy E, MD   237 mL at 02/02/22 1346   haloperidol (HALDOL) tablet 5 mg  5 mg Oral Q8H PRN Carlyn Reichert, MD       Or   haloperidol lactate (HALDOL) injection 5 mg  5 mg Intramuscular Q8H PRN Carlyn Reichert, MD       hydrOXYzine (ATARAX) tablet 25 mg  25 mg Oral TID PRN Ajibola, Ene A, NP   25 mg at 02/03/22 2045   LORazepam (ATIVAN) tablet 1 mg  1 mg Oral Q6H PRN Comer Locket, MD       Or   LORazepam (ATIVAN) injection 1 mg  1 mg Intramuscular Q6H PRN Mason Jim, Amy E, MD       magnesium hydroxide (MILK OF MAGNESIA) suspension 30 mL  30 mL Oral Daily PRN Princess Bruins, DO       melatonin tablet 3 mg  3 mg Oral QHS Princess Bruins, DO   3 mg at 02/03/22 2045   multivitamin with minerals tablet 1 tablet  1 tablet Oral Daily Princess Bruins, DO   1 tablet at 02/04/22 0824   nicotine (NICODERM CQ - dosed in mg/24 hours) patch 21 mg  21 mg  Transdermal Daily Carlyn Reichert, MD   21 mg at 02/04/22 0827   risperiDONE (RISPERDAL) tablet 1 mg  1 mg Oral BH-qamhs Massengill, Nathan, MD       rivaroxaban Carlena Hurl) tablet 20 mg  20 mg Oral Daily Princess Bruins, DO   20 mg at 02/04/22 5284   thiamine tablet 100 mg  100 mg Oral Daily Princess Bruins, DO   100 mg at 02/04/22 1324   PTA Medications: Medications Prior to Admission  Medication Sig Dispense Refill Last Dose   benztropine (COGENTIN) 0.5 MG tablet Take 0.5 mg by mouth daily. (Patient not taking: Reported on 01/28/2022)      dorzolamide-timolol (COSOPT) 22.3-6.8 MG/ML ophthalmic solution Place 1 drop into both eyes 2 (two) times daily.      melatonin 3 MG TABS tablet Take 1 tablet (3 mg total) by mouth at bedtime.  0    Multiple Vitamin (MULTIVITAMIN WITH MINERALS) TABS tablet Take 1 tablet by mouth daily.      nicotine polacrilex (NICORETTE) 2 MG gum Take 1 each (2 mg total) by mouth as needed for smoking cessation. 100 tablet 0  thiamine 100 MG tablet Take 1 tablet (100 mg total) by mouth daily.      XARELTO 20 MG TABS tablet Take 20 mg by mouth daily.       Patient Stressors: Financial difficulties   Loss of significant relationship "wife died 7 years ago"   Substance abuse    Patient Strengths: Capable of independent living  Contractor  Religious Affiliation  Supportive family/friends   Treatment Modalities: Medication Management, Group therapy, Case management,  1 to 1 session with clinician, Psychoeducation, Recreational therapy.   Physician Treatment Plan for Primary Diagnosis: Schizoaffective disorder (HCC) Long Term Goal(s): Improvement in symptoms so as ready for discharge   Short Term Goals: Ability to identify changes in lifestyle to reduce recurrence of condition will improve Ability to verbalize feelings will improve  Medication Management: Evaluate patient's response, side effects, and tolerance of medication regimen.  Therapeutic Interventions: 1  to 1 sessions, Unit Group sessions and Medication administration.  Evaluation of Outcomes: Progressing  Physician Treatment Plan for Secondary Diagnosis: Principal Problem:   Schizoaffective disorder (HCC) Active Problems:   Homelessness   Suicidal ideation  Long Term Goal(s): Improvement in symptoms so as ready for discharge   Short Term Goals: Ability to identify changes in lifestyle to reduce recurrence of condition will improve Ability to verbalize feelings will improve     Medication Management: Evaluate patient's response, side effects, and tolerance of medication regimen.  Therapeutic Interventions: 1 to 1 sessions, Unit Group sessions and Medication administration.  Evaluation of Outcomes: Progressing   RN Treatment Plan for Primary Diagnosis: Schizoaffective disorder (HCC) Long Term Goal(s): Knowledge of disease and therapeutic regimen to maintain health will improve  Short Term Goals: Ability to remain free from injury will improve, Ability to participate in decision making will improve, Ability to verbalize feelings will improve, Ability to disclose and discuss suicidal ideas, and Ability to identify and develop effective coping behaviors will improve  Medication Management: RN will administer medications as ordered by provider, will assess and evaluate patient's response and provide education to patient for prescribed medication. RN will report any adverse and/or side effects to prescribing provider.  Therapeutic Interventions: 1 on 1 counseling sessions, Psychoeducation, Medication administration, Evaluate responses to treatment, Monitor vital signs and CBGs as ordered, Perform/monitor CIWA, COWS, AIMS and Fall Risk screenings as ordered, Perform wound care treatments as ordered.  Evaluation of Outcomes: Progressing   LCSW Treatment Plan for Primary Diagnosis: Schizoaffective disorder (HCC) Long Term Goal(s): Safe transition to appropriate next level of care at  discharge, Engage patient in therapeutic group addressing interpersonal concerns.  Short Term Goals: Engage patient in aftercare planning with referrals and resources, Increase social support, Increase emotional regulation, Facilitate acceptance of mental health diagnosis and concerns, Identify triggers associated with mental health/substance abuse issues, and Increase skills for wellness and recovery  Therapeutic Interventions: Assess for all discharge needs, 1 to 1 time with Social worker, Explore available resources and support systems, Assess for adequacy in community support network, Educate family and significant other(s) on suicide prevention, Complete Psychosocial Assessment, Interpersonal group therapy.  Evaluation of Outcomes: Progressing   Progress in Treatment: Attending groups: No. Participating in groups: No. Taking medication as prescribed: Yes. Toleration medication: Yes. Family/Significant other contact made: No, will contact:  patient has declined consents Patient understands diagnosis: Yes. Discussing patient identified problems/goals with staff: Yes. Medical problems stabilized or resolved: Yes. Denies suicidal/homicidal ideation: Yes. Issues/concerns per patient self-inventory: No.   New problem(s) identified: No, Describe:  none  reported   New Short Term/Long Term Goal(s): detox, medication management for mood stabilization; elimination of SI thoughts; development of comprehensive mental wellness/sobriety plan     Patient Goals:  Patient states, "I want to stop hearing voices"   Discharge Plan or Barriers: Patient recently admitted. CSW will continue to follow and assess for appropriate referrals and possible discharge planning.      Reason for Continuation of Hospitalization: Depression Hallucinations Suicidal ideation Withdrawal symptoms   Estimated Length of Stay: 5-7 days   Last 3 [place] Suicide Severity Risk Score: Flowsheet Row Admission (Current)  [facility]27/[facility] in [facility]  C-SSRS RISK CATEGORY High Risk High Risk       Last [facility] 2/9 Scores:     No data to display          Scribe for Treatment Team: Catha Brow 02/04/2022 10:38 AM

## 2022-02-04 NOTE — Progress Notes (Signed)
CSW contacted Winferd Humphrey from [facility] to discuss possible admission.  Winferd Humphrey reported that in order for Patient to be admitted, he must complete a screener, pay $300 enrollment fee and prorated monthly rent (approximately $20 per day).  Winferd Humphrey also reported that Patient did not call but had someone call of his behalf.  [facility] is open to completing screener with Patient when Patient calls, himself.  Nolon Rod, LCSW Clinical Social Worker [facility]

## 2022-02-04 NOTE — Group Note (Signed)
Recreation Therapy Group Note   Group Topic:Team Building  Group Date: 02/04/2022 Start Time: 1000 End Time: 1025 Facilitators: [name], LRT,CTRS Location: [address] Dayroom   Goal Area(s) Addresses:  Patient will effectively work with peer towards shared goal.  Patient will identify skills used to make activity successful.  Patient will identify how skills used during activity can be applied to reach post d/c goals.      Group Description: [employer]. In teams of 5-6, patients were given 20 small craft pipe cleaners. Using the materials provided, patients were instructed to compete again the opposing team(s) to build the tallest free-standing structure from floor level. The activity was timed; difficulty increased by Clinical research associate as Production designer, theatre/television/film continued.  Systematically resources were removed with additional directions for example, placing one arm behind their back, working in silence, and shape stipulations. LRT facilitated post-activity discussion reviewing team processes and necessary communication skills involved in completion. Patients were encouraged to reflect how the skills utilized, or not utilized, in this activity can be incorporated to positively impact support systems post discharge.   Affect/Mood: N/A   Participation Level: Did not attend    Clinical Observations/Individualized Feedback:     Plan: Continue to engage patient in RT group sessions 2-3x/week.   [name], LRT,CTRS 02/04/2022 12:19 PM

## 2022-02-05 MED ORDER — NICOTINE 21 MG/24HR TD PT24
21.0000 mg | MEDICATED_PATCH | Freq: Every day | TRANSDERMAL | 0 refills | Status: AC
Start: 2022-02-06 — End: ?

## 2022-02-05 MED ORDER — RISPERIDONE 1 MG PO TABS: 1.0000 mg | ORAL_TABLET | ORAL | 0 refills | Status: AC

## 2022-02-05 NOTE — BHH Group Notes (Signed)
Adult Psychoeducational Group Note  Date:  02/05/2022 Time:  8:38 AM  Group Topic/Focus:  Goals Group:   The focus of this group is to help patients establish daily goals to achieve during treatment and discuss how the patient can incorporate goal setting into their daily lives to aide in recovery.  Participation Level:  Did Not Attend    Donell Beers 02/05/2022, 8:38 AM

## 2022-02-05 NOTE — Discharge Summary (Signed)
Physician Discharge Summary Note  Patient:  Harry Jones is an 66 y.o., male MRN:  [id number] DOB:  [birth year] Patient phone:  [phone] (home)  Patient address:   [address] [address],  Total Time spent with patient: 20 minutes  Date of Admission:  01/28/2022 Date of Discharge: 02/05/2022  Reason for Admission:   Harry Jones is a 66 year old male with a self-reported past psychiatric history of schizophrenia, bipolar disorder, and depression.  There is very little information available about this patient in the chart previous to 2 days ago.  He was transferred from the behavioral health urgent care to the [facility] behavioral health hospital due to suicidal thoughts with a plan to walk into traffic.  He has also been reporting command auditory hallucinations.  He is admitted on a voluntary basis.   Mode of transport to Hospital: [facility] to [facility] initially Current Outpatient (Home) Medication List: Cogentin, eye drops, and Xarelto PRN medication prior to evaluation: NA   ED course: uneventful, ED provider (Dr. Cyndie Chime) noted concern for secondary gain Collateral Information: patient denies collateral contact POA/Legal Guardian: unknown   HPI:    Per chart review, the patient presented to the [facility] behavioral health urgent care on 7/4 via the [facility].  Per reports, a hotel manager called the police because the patient was reporting suicidal thoughts with a plan to walk into traffic.  At the behavioral health urgent care, the patient endorsed chronic command auditory hallucinations and visual hallucinations.  There is no evidence of internal preoccupation or responding to internal stimuli.  Dr. Cyndie Chime was concerned for secondary gain given that the patient would not engage with evaluation and was vague in his description of psychosis.   Of note, there is no documentation available for this patient before 7/4. No significant documentation is listed in care  everywhere.   The patient is seen in the late morning at the [facility] behavioral health hospital on 7/6.  He is finishing up his conversation with the Child psychotherapist.  She asks him to sign some paperwork.  The patient appears concerned that he is being discharged and yells at the social worker "if you discharge me I will jump off a bridge".  He repeats this several times.  Interview with the patient after this is difficult.  He appears very suspicious and guarded and is unwilling to divulge autobiographical details.  At the same time I question if there is some cognitive impairment contributing to his inability to remember details from his life.  He is willing to state that he lives under a bridge somewhere with a group of people.  He states that he desires to get better in order to help this group of people.   Unable to perform psychiatric review of symptoms given the patient's unwillingness to engage in interview.   On approach later in the day, the patient is willing to start low-dose Seroquel for his reported hallucinations.   Past Psychiatric Hx: Unable to collect   Substance Abuse Hx: Alcohol: Unable to collect Tobacco: Unable to collect Illicit drugs: UDS positive for cocaine and THC   Past Medical History: Unable to collect   Family History: Unable to collect   Social History: Unable to collect   Principal Problem: Schizoaffective disorder [facility]) Discharge Diagnoses: Principal Problem:   Schizoaffective disorder (HCC) Active Problems:   Homelessness   Suicidal ideation   Past Psychiatric History: patient has no knowledge of previous psychiatric diagnoses but does state he was  at [facility] 1 year ago.   Past Medical History: No past medical history on file. No past surgical history on file. Family History: No family history on file. Family Psychiatric  History: unknown Social History:  Social History   Substance and Sexual Activity  Alcohol Use None     Social History    Substance and Sexual Activity  Drug Use Not on file    Social History   Socioeconomic History   Marital status: Widowed    Spouse name: Not on file   Number of children: Not on file   Years of education: Not on file   Highest education level: Not on file  Occupational History   Not on file  Tobacco Use   Smoking status: Every Day    Packs/day: 1.00    Types: Cigarettes   Smokeless tobacco: Never  Substance and Sexual Activity   Alcohol use: Not on file   Drug use: Not on file   Sexual activity: Not on file  Other Topics Concern   Not on file  Social History Narrative   Per chart review & Nursing report: Pt is homeless lives [place] a bridge. Wife died 7 years ago.   Social Determinants of Health   Financial Resource Strain: Not on file  Food Insecurity: Not on file  Transportation Needs: Not on file  Physical Activity: Not on file  Stress: Not on file  Social Connections: Not on file    Hospital Course:   HOSPITAL COURSE:   During the patient's hospitalization, patient had extensive initial psychiatric evaluation, and follow-up psychiatric evaluations every day.   Psychiatric diagnoses provided upon initial assessment:  Schizoaffective disorder, unspecified type (r/o schizophrenia) R/o Stimulant (cocaine type) and cannabis use disorders   Patient's psychiatric medications were adjusted on admission:  Start Seroquel 25 mg nightly Continued home Cogentin 0.5 mg daily     During the hospitalization, other adjustments were made to the patient's psychiatric medication regimen:  Changed to Risperdal and titrated to 1 mg BID for psychosis Discontinued Cogentin and moved to PRN   Patient's care was discussed during the interdisciplinary team meeting every day during the hospitalization.   The patient denied having side effects to prescribed psychiatric medication.   Gradually, patient started adjusting to milieu. The patient was evaluated each day by a clinical  provider to ascertain response to treatment. Improvement was noted by the patient's report of decreasing symptoms, improved sleep and appetite, affect, medication tolerance, behavior, and participation in unit programming.  Patient was asked each day to complete a self inventory noting mood, mental status, pain, new symptoms, anxiety and concerns.     Symptoms were reported as significantly decreased or resolved completely by discharge.    On day of discharge, the patient reports that their mood is stable. The patient denied having suicidal thoughts for more than 48 hours prior to discharge.  Patient denies having homicidal thoughts.  Patient denies having auditory hallucinations.  Patient denies any visual hallucinations or other symptoms of psychosis. The patient was motivated to continue taking medication with a goal of continued improvement in mental health.    The patient reports their target psychiatric symptoms of auditory hallucinations responded well to the psychiatric medications, and the patient reports overall benefit other psychiatric hospitalization. Supportive psychotherapy was provided to the patient. The patient also participated in regular group therapy while hospitalized. Coping skills, problem solving as well as relaxation therapies were also part of the unit programming.   Labs were  reviewed with the patient, and abnormal results were discussed with the patient.   The patient is able to verbalize their individual safety plan to this provider.   # It is recommended to the patient to continue psychiatric medications as prescribed, after discharge from the hospital.     # It is recommended to the patient to follow up with your outpatient psychiatric provider and PCP.   # It was discussed with the patient, the impact of alcohol, drugs, tobacco have been there overall psychiatric and medical wellbeing, and total abstinence from substance use was recommended the patient.ed.   #  Prescriptions provided or sent directly to preferred pharmacy at discharge. Patient agreeable to plan. Given opportunity to ask questions. Appears to feel comfortable with discharge.    # In the event of worsening symptoms, the patient is instructed to call the crisis hotline, 911 and or go to the nearest ED for appropriate evaluation and treatment of symptoms. To follow-up with primary care provider for other medical issues, concerns and or health care needs   # Patient was discharged to the [facility] with a plan to follow up as noted below.     Total Time spent with patient: 20 minutes   Physical Findings: -AIMS: 0 (assessed 7/11) -No orofacial movements noted -No cogwheeling or rigidity, no tremor or bradykinesia   CIWA:  CIWA-Ar Total: 0   Musculoskeletal: Strength & Muscle Tone: within normal limits Gait & Station: normal Patient leans: N/A   Psychiatric Specialty Exam:   Presentation  General Appearance: Disheveled   Eye Contact:Fair   Speech:Clear and Coherent   Speech Volume:Decreased   Handedness:Right     Mood and Affect  Mood: "good" Affect: euthymic at times but often constricted   Thought Process  Thought Processes: relatively linear, but concrete with no abstract thought Descriptions of Associations:Intact   Orientation:Full (Time, Place and Person)   Thought Content: he denies homicidal thoughts and suicidal thoughts; he reports occasional AH telling him he is not doing well and VH of shadows; he is not grossly responding to internal/external stimuli on exam   History of Schizophrenia/Schizoaffective disorder:-- (unknown)   Duration of Psychotic Symptoms:-- (unknown)   Hallucinations: auditory hallucinations greatly decreased in intensity and frequency Ideas of Reference: none reported today   Suicidal Thoughts: denies   Homicidal Thoughts: denies     Sensorium  Memory:Immediate Poor; Recent Poor; Remote Poor   Judgment:Poor    Insight:Poor     Executive Functions  Concentration:Poor   Attention Span:Poor   Recall:Poor   Fund of Knowledge:Poor   Language:Poor     Psychomotor Activity  Psychomotor Activity: normal     Assets  Assets:Communication Skills; Desire for Improvement; Resilience; Leisure Time     Sleep  fair     Physical Exam Constitutional:      Appearance: the patient is not toxic-appearing.  Pulmonary:     Effort: Pulmonary effort is normal.  Neurological:     General: No focal deficit present.     Mental Status: the patient is alert and oriented to person, place, and time.    Review of Systems  Respiratory:  Negative for shortness of breath.   Cardiovascular:  Negative for chest pain.  Gastrointestinal:  Negative for abdominal pain, constipation, diarrhea, nausea and vomiting.  Neurological:  Negative for headaches. Blood pressure 117/80, pulse 88, temperature 97.7 F (36.5 C), temperature source Oral, resp. rate 16, height 6\' 5"  (1.956 m), weight 102.1 kg, SpO2 99 %. Body mass  index is 26.68 kg/m.   Social History   Tobacco Use  Smoking Status Every Day   Packs/day: 1.00   Types: Cigarettes  Smokeless Tobacco Never   Tobacco Cessation:  A prescription for an FDA-approved tobacco cessation medication provided at discharge   Blood Alcohol level:  Lab Results  Component Value Date   ETH <10 01/27/2022    Metabolic Disorder Labs:  Lab Results  Component Value Date   HGBA1C 5.5 01/27/2022   MPG 111.15 01/27/2022   No results found for: "PROLACTIN" Lab Results  Component Value Date   CHOL 187 01/27/2022   TRIG 46 01/27/2022   HDL 79 01/27/2022   CHOLHDL 2.4 01/27/2022   VLDL 9 01/27/2022   LDLCALC 99 01/27/2022    See Psychiatric Specialty Exam and Suicide Risk Assessment completed by Attending Physician prior to discharge.  Discharge destination:  Home  Is patient on multiple antipsychotic therapies at discharge:  No   Has Patient had three or  more failed trials of antipsychotic monotherapy by history:  No  Recommended Plan for Multiple Antipsychotic Therapies: NA  Discharge Instructions     Diet - low sodium heart healthy   Complete by: As directed    Increase activity slowly   Complete by: As directed       Allergies as of 02/05/2022   No Known Allergies      Medication List     STOP taking these medications    benztropine 0.5 MG tablet Commonly known as: COGENTIN   nicotine polacrilex 2 MG gum Commonly known as: NICORETTE       TAKE these medications      Indication  dorzolamide-timolol 22.3-6.8 MG/ML ophthalmic solution Commonly known as: COSOPT Place 1 drop into both eyes 2 (two) times daily.  Indication: Increased Pressure Within the Eye   melatonin 3 MG Tabs tablet Take 1 tablet (3 mg total) by mouth at bedtime.  Indication: Trouble Sleeping   multivitamin with minerals Tabs tablet Take 1 tablet by mouth daily.  Indication: vitamin support   nicotine 21 mg/24hr patch Commonly known as: NICODERM CQ - dosed in mg/24 hours Place 1 patch (21 mg total) onto the skin daily. Start taking on: February 06, 2022  Indication: Nicotine Addiction   risperiDONE 1 MG tablet Commonly known as: RISPERDAL Take 1 tablet (1 mg total) by mouth 2 (two) times daily in the am and at bedtime..  Indication: Schizophrenia   thiamine 100 MG tablet Take 1 tablet (100 mg total) by mouth daily.  Indication: Deficiency of Vitamin B1   Xarelto 20 MG Tabs tablet Generic drug: rivaroxaban Take 20 mg by mouth daily.  Indication: Blood Clot in a Deep Vein        Follow-up Information     [facility] Follow up.   Specialty: Behavioral Health Why: You may go to this provider for an assessment to obtain therapy and medication management services at walk in times:  Mondays and Wednesdays, arrive at 7:30 am. Contact information: [address] [place] [address] [phone]                 Follow-up recommendations:   Activity: as tolerated   Diet: heart healthy   Other: -Follow-up with your outpatient psychiatric provider -instructions on appointment date, time, and address (location) are provided to you in discharge paperwork.   -Take your psychiatric medications as prescribed at discharge - instructions are provided to you in the discharge paperwork   -  Follow-up with outpatient primary care doctor and other specialists -for management of chronic medical disease, including: Chronic PE d/t R LE DVT, on Xarelto. Hematuria, seen on multiple UAs. Follow up scheduled with [facility].    -Testing: Follow-up with outpatient provider for abnormal lab results: as above   -Recommend abstinence from alcohol, tobacco, and other illicit drug use at discharge.    -If your psychiatric symptoms recur, worsen, or if you have side effects to your psychiatric medications, call your outpatient psychiatric provider, 911, [phone] or go to the nearest emergency department.  Comments:  none  Signed: Carlyn ReichertNick Marieliz Strang, MD 02/05/2022, 3:27 PM

## 2022-02-05 NOTE — BHH Suicide Risk Assessment (Signed)
[facility] Discharge Suicide Risk Assessment   Principal Problem: Schizoaffective disorder [facility]) Discharge Diagnoses: Principal Problem:   Schizoaffective disorder (HCC) Active Problems:   Homelessness   Suicidal ideation   HOSPITAL COURSE:  During the patient's hospitalization, patient had extensive initial psychiatric evaluation, and follow-up psychiatric evaluations every day.  Psychiatric diagnoses provided upon initial assessment:  Schizoaffective disorder, unspecified type (r/o schizophrenia) R/o Stimulant (cocaine type) and cannabis use disorders  Patient's psychiatric medications were adjusted on admission:  Start Seroquel 25 mg nightly Continued home Cogentin 0.5 mg daily   During the hospitalization, other adjustments were made to the patient's psychiatric medication regimen:  Changed to Risperdal and titrated to 1 mg BID for psychosis Discontinued Cogentin and moved to PRN  Patient's care was discussed during the interdisciplinary team meeting every day during the hospitalization.  The patient denied having side effects to prescribed psychiatric medication.  Gradually, patient started adjusting to milieu. The patient was evaluated each day by a clinical provider to ascertain response to treatment. Improvement was noted by the patient's report of decreasing symptoms, improved sleep and appetite, affect, medication tolerance, behavior, and participation in unit programming.  Patient was asked each day to complete a self inventory noting mood, mental status, pain, new symptoms, anxiety and concerns.    Symptoms were reported as significantly decreased or resolved completely by discharge.   On day of discharge, the patient reports that their mood is stable. The patient denied having suicidal thoughts for more than 48 hours prior to discharge.  Patient denies having homicidal thoughts.  Patient denies having auditory hallucinations.  Patient denies any visual hallucinations or  other symptoms of psychosis. The patient was motivated to continue taking medication with a goal of continued improvement in mental health.   The patient reports their target psychiatric symptoms of auditory hallucinations responded well to the psychiatric medications, and the patient reports overall benefit other psychiatric hospitalization. Supportive psychotherapy was provided to the patient. The patient also participated in regular group therapy while hospitalized. Coping skills, problem solving as well as relaxation therapies were also part of the unit programming.  Labs were reviewed with the patient, and abnormal results were discussed with the patient.  The patient is able to verbalize their individual safety plan to this provider.  # It is recommended to the patient to continue psychiatric medications as prescribed, after discharge from the hospital.    # It is recommended to the patient to follow up with your outpatient psychiatric provider and PCP.  # It was discussed with the patient, the impact of alcohol, drugs, tobacco have been there overall psychiatric and medical wellbeing, and total abstinence from substance use was recommended the patient.ed.  # Prescriptions provided or sent directly to preferred pharmacy at discharge. Patient agreeable to plan. Given opportunity to ask questions. Appears to feel comfortable with discharge.    # In the event of worsening symptoms, the patient is instructed to call the crisis hotline, 911 and or go to the nearest ED for appropriate evaluation and treatment of symptoms. To follow-up with primary care provider for other medical issues, concerns and or health care needs  # Patient was discharged to the [facility] with a plan to follow up as noted below.   Total Time spent with patient: 20 minutes  Physical Findings: -AIMS: 0 (assessed 7/11) -No orofacial movements noted -No cogwheeling or rigidity, no tremor or bradykinesia   CIWA:   CIWA-Ar Total: 0   Musculoskeletal: Strength & Muscle Tone: within  normal limits Gait & Station: normal Patient leans: N/A   Psychiatric Specialty Exam:   Presentation  General Appearance: Disheveled   Eye Contact:Fair   Speech:Clear and Coherent   Speech Volume:Decreased   Handedness:Right     Mood and Affect  Mood: "good" Affect: euthymic at times but often constricted   Thought Process  Thought Processes: relatively linear, but concrete with no abstract thought Descriptions of Associations:Intact   Orientation:Full (Time, Place and Person)   Thought Content: he denies homicidal thoughts and suicidal thoughts; he reports occasional AH telling him he is not doing well and VH of shadows; he is not grossly responding to internal/external stimuli on exam   History of Schizophrenia/Schizoaffective disorder:-- (unknown)   Duration of Psychotic Symptoms:-- (unknown)   Hallucinations: auditory hallucinations greatly decreased in intensity and frequency Ideas of Reference: none reported today   Suicidal Thoughts: denies   Homicidal Thoughts: denies     Sensorium  Memory:Immediate Poor; Recent Poor; Remote Poor   Judgment:Poor   Insight:Poor     Executive Functions  Concentration:Poor   Attention Span:Poor   Recall:Poor   Fund of Knowledge:Poor   Language:Poor     Psychomotor Activity  Psychomotor Activity: normal     Assets  Assets:Communication Skills; Desire for Improvement; Resilience; Leisure Time     Sleep  fair     Physical Exam Constitutional:      Appearance: the patient is not toxic-appearing.  Pulmonary:     Effort: Pulmonary effort is normal.  Neurological:     General: No focal deficit present.     Mental Status: the patient is alert and oriented to person, place, and time.    Review of Systems  Respiratory:  Negative for shortness of breath.   Cardiovascular:  Negative for chest pain.  Gastrointestinal:  Negative for  abdominal pain, constipation, diarrhea, nausea and vomiting.  Neurological:  Negative for headaches. Blood pressure 117/80, pulse 88, temperature 97.7 F (36.5 C), temperature source Oral, resp. rate 16, height 6\' 5"  (1.956 m), weight 102.1 kg, SpO2 99 %. Body mass index is 26.68 kg/m.  Mental Status Per Nursing Assessment::   On Admission:  Self-harm thoughts  Demographic Factors:  Age 85 or older  Loss Factors: NA  Historical Factors: NA  Risk Reduction Factors:   Positive coping skills or problem solving skills  Continued Clinical Symptoms:  Schizophrenia  Cognitive Features That Contribute To Risk:  None    Suicide Risk:  Mild: Patient with no known history of suicidal attempts, presented with suicidal ideation, possibly in the context of secondary gain.  He has not denied suicidal thoughts for several days in a row.  He is future oriented and iss demonstrating good behavioral and emotional regulation on the unit.  Feel that his psychotic symptoms are adequately controlled at this point.   [place] Follow up.   Specialty: Behavioral Health Why: You may go to this provider for an assessment to obtain therapy and medication management services at walk in times:  Mondays and Wednesdays, arrive at 7:30 am. Contact information: [place] [phone]                Plan Of Care/Follow-up recommendations:  Activity: as tolerated  Diet: heart healthy  Other: -Follow-up with your outpatient psychiatric provider -instructions on appointment date, time, and address (location) are provided to you in discharge paperwork.  -Take your psychiatric medications as prescribed at  discharge - instructions are provided to you in the discharge paperwork  -Follow-up with outpatient primary care doctor and other specialists -for management of chronic medical disease, including: Chronic PE  d/t R LE DVT, on Xarelto. Hematuria, seen on multiple UAs. Follow up scheduled with [facility].   -Testing: Follow-up with outpatient provider for abnormal lab results: as above  -Recommend abstinence from alcohol, tobacco, and other illicit drug use at discharge.   -If your psychiatric symptoms recur, worsen, or if you have side effects to your psychiatric medications, call your outpatient psychiatric provider, 911, [phone] or go to the nearest emergency department.  Carlyn Reichert, MD 02/05/2022, 3:10 PM

## 2022-02-05 NOTE — Progress Notes (Signed)
Pt discharged to lobby. Pt was stable and appreciative at that time. All papers, samples and prescriptions were given and valuables returned. Verbal understanding expressed. Denies SI/HI and A/VH. Pt given opportunity to express concerns and ask questions.  

## 2022-02-05 NOTE — Progress Notes (Signed)
  [facility] Adult Case Management Discharge Plan :  Will you be returning to the same living situation after discharge:  Yes,  shelter At discharge, do you have transportation home?: Yes,  taxi Do you have the ability to pay for your medications: Yes,  insurance  Release of information consent forms completed and in the chart;  Patient's signature needed at discharge.  Patient to Follow up at:  Follow-up Information     [facility] Follow up.   Specialty: Behavioral Health Why: You may go to this provider for an assessment to obtain therapy and medication management services at walk in times:  Mondays and Wednesdays, arrive at 7:30 am. Contact information: [address] [place] [address] [phone]                Next level of care provider has access to [facility] Link:yes  Safety Planning and Suicide Prevention discussed: Yes,  patient declined      Has patient been referred to the Quitline?: Patient refused referral  Patient has been referred for addiction treatment: Pt. refused referral, patient is open to going to sober living but does not have the money to go at this time.  Patient states that he gets paid the first of the month and will stay in shelter until he gets his check and then call [facility] to stay in a sober living environment.   Nathon Stefanski E Alyce Inscore, LCSW 02/05/2022, 3:46 PM

## 2022-02-05 NOTE — Progress Notes (Signed)
   02/05/22 0540  Sleep  Number of Hours 6.5

## 2022-02-05 NOTE — Group Note (Signed)
Recreation Therapy Group Note   Group Topic:Leisure Education  Group Date: 02/05/2022 Start Time: 1000 End Time: 1030 Facilitators: [name], LRT,CTRS Location: [address] Dayroom   Goal Area(s) Addresses:  Patient will identify positive leisure activities for use post discharge. Patient will identify at least one positive benefit of participation in leisure activities.  Patient will work effectively work with peer in completing the activity.  Group Description:  Patients were give one rubber disk a piece and one extra for the group.  Patients were to work as a team using the discs to get from one end of the room to the other and back.  Patients were to accomplish this by stepping on the disc, if anyone stepped off, the group had to start over.  Once the patients completed the task, they had to create a game of their own using the same rubber discs.  Patients need to come up with the rules, age limit, number of players and how the game is to be played.   Affect/Mood: N/A   Participation Level: Did not attend    Clinical Observations/Individualized Feedback:    Plan: Continue to engage patient in RT group sessions 2-3x/week.   [name], LRT,CTRS 02/05/2022 1:28 PM

## 2022-02-17 ENCOUNTER — Telehealth (HOSPITAL_COMMUNITY): Payer: Self-pay

## 2022-02-17 NOTE — BH Assessment (Signed)
Care Management - Follow Up [facility] Discharges   Patient has been placed in an inpatient psychiatric hospital ([facility]) on 01-28-2022. 

## 2022-05-13 ENCOUNTER — Emergency Department (HOSPITAL_BASED_OUTPATIENT_CLINIC_OR_DEPARTMENT_OTHER)
Admission: EM | Admit: 2022-05-13 | Discharge: 2022-05-13 | Disposition: A | Discharge status: Home / Self Care | Payer: Medicare HMO | Attending: Emergency Medicine | Admitting: Emergency Medicine

## 2022-05-13 ENCOUNTER — Emergency Department (HOSPITAL_BASED_OUTPATIENT_CLINIC_OR_DEPARTMENT_OTHER): Payer: Medicare HMO | Admitting: Radiology

## 2022-05-13 ENCOUNTER — Other Ambulatory Visit (HOSPITAL_BASED_OUTPATIENT_CLINIC_OR_DEPARTMENT_OTHER): Payer: Self-pay

## 2022-05-13 ENCOUNTER — Encounter (HOSPITAL_BASED_OUTPATIENT_CLINIC_OR_DEPARTMENT_OTHER): Payer: Self-pay

## 2022-05-13 ENCOUNTER — Other Ambulatory Visit: Payer: Self-pay

## 2022-05-13 DIAGNOSIS — M25531 Pain in right wrist: Secondary | ICD-10-CM | POA: Insufficient documentation

## 2022-05-13 DIAGNOSIS — S62001P Unspecified fracture of navicular [scaphoid] bone of right wrist, subsequent encounter for fracture with malunion: Secondary | ICD-10-CM

## 2022-05-13 DIAGNOSIS — W101XXA Fall (on)(from) sidewalk curb, initial encounter: Secondary | ICD-10-CM | POA: Insufficient documentation

## 2022-05-13 DIAGNOSIS — I10 Essential (primary) hypertension: Secondary | ICD-10-CM | POA: Insufficient documentation

## 2022-05-13 DIAGNOSIS — S63501A Unspecified sprain of right wrist, initial encounter: Secondary | ICD-10-CM

## 2022-05-13 DIAGNOSIS — Z79899 Other long term (current) drug therapy: Secondary | ICD-10-CM | POA: Insufficient documentation

## 2022-05-13 DIAGNOSIS — Z7901 Long term (current) use of anticoagulants: Secondary | ICD-10-CM | POA: Insufficient documentation

## 2022-05-13 DIAGNOSIS — S62011A Displaced fracture of distal pole of navicular [scaphoid] bone of right wrist, initial encounter for closed fracture: Secondary | ICD-10-CM | POA: Diagnosis not present

## 2022-05-13 DIAGNOSIS — S6991XA Unspecified injury of right wrist, hand and finger(s), initial encounter: Secondary | ICD-10-CM | POA: Diagnosis present

## 2022-05-13 MED ORDER — IBUPROFEN 400 MG PO TABS
600.0000 mg | ORAL_TABLET | Freq: Once | ORAL | Status: AC
  Administered 2022-05-13: 600 mg via ORAL
  Filled 2022-05-13: qty 1

## 2022-05-13 MED ORDER — IBUPROFEN 600 MG PO TABS: 600.0000 mg | ORAL_TABLET | Freq: Three times a day (TID) | ORAL | 0 refills | Status: AC | PRN

## 2022-05-13 NOTE — ED Provider Notes (Signed)
[facility] EMERGENCY DEPT Provider Note   CSN: [id number] Arrival date & time: 05/13/22  1004     History  Chief Complaint  Patient presents with   Fall   Wrist Pain    Harry Jones is a 66 y.o. male.   Fall  Wrist Pain     Patient has a history of mental health problems, hypertension prior alcoholism currently sober who presents to the ED for a wrist injury.  Patient states he was walking back from an [place] meeting on Monday when he accidentally tripped and stumbled landing on his outstretched right wrist.  Patient states since that time has had some pain and swelling in his wrist.  It hurts to move it.  He denies any other injuries.  Home Medications Prior to Admission medications   Medication Sig Start Date End Date Taking? Authorizing Provider  ibuprofen (ADVIL) 600 MG tablet Take 1 tablet (600 mg total) by mouth every 8 (eight) hours as needed. 05/13/22  Yes Dorie Rank, MD  amLODipine (NORVASC) 5 MG tablet Take 5 mg by mouth every morning. 04/28/22   [provider]  dorzolamide-timolol (COSOPT) 22.3-6.8 MG/ML ophthalmic solution Place 1 drop into both eyes 2 (two) times daily. 10/23/21   [provider]  melatonin 3 MG TABS tablet Take 1 tablet (3 mg total) by mouth at bedtime. 01/28/22   Merrily Brittle, DO  Multiple Vitamin (MULTIVITAMIN WITH MINERALS) TABS tablet Take 1 tablet by mouth daily. 01/29/22   Merrily Brittle, DO  nicotine (NICODERM CQ - DOSED IN MG/24 HOURS) 21 mg/24hr patch Place 1 patch (21 mg total) onto the skin daily. 02/06/22   Corky Sox, MD  risperiDONE (RISPERDAL) 1 MG tablet Take 1 tablet (1 mg total) by mouth 2 (two) times daily in the am and at bedtime.. 02/05/22   Corky Sox, MD  thiamine 100 MG tablet Take 1 tablet (100 mg total) by mouth daily. 01/29/22   Merrily Brittle, DO  Vitamin D, Ergocalciferol, 50000 units CAPS Take 1 capsule by mouth once a week. 04/28/22   [provider]  XARELTO 20 MG TABS tablet  Take 20 mg by mouth daily. 12/06/21   [provider]      Allergies    Patient has no known allergies.    Review of Systems   Review of Systems  Physical Exam Updated Vital Signs BP 130/86 (BP Location: Right Arm)   Pulse 73   Temp 98.7 F (37.1 C) (Oral)   Resp (!) 22   Ht 1.956 m (6\' 5" )   Wt 131.5 kg   SpO2 98%   BMI 34.39 kg/m  Physical Exam Vitals and nursing note reviewed.  Constitutional:      General: He is not in acute distress.    Appearance: He is well-developed.  HENT:     Head: Normocephalic and atraumatic.     Right Ear: External ear normal.     Left Ear: External ear normal.  Eyes:     General: No scleral icterus.       Right eye: No discharge.        Left eye: No discharge.     Conjunctiva/sclera: Conjunctivae normal.  Neck:     Trachea: No tracheal deviation.  Cardiovascular:     Rate and Rhythm: Normal rate.  Pulmonary:     Effort: Pulmonary effort is normal. No respiratory distress.     Breath sounds: No stridor.  Abdominal:     General: There is no  distension.  Musculoskeletal:        General: Swelling and tenderness present.     Right wrist: Swelling, tenderness, bony tenderness and snuff box tenderness present. Decreased range of motion.     Cervical back: Neck supple.  Skin:    General: Skin is warm and dry.     Findings: No rash.  Neurological:     Mental Status: He is alert.     Cranial Nerves: Cranial nerve deficit: no gross deficits.     ED Results / Procedures / Treatments   Labs (all labs ordered are listed, but only abnormal results are displayed) Labs Reviewed - No data to display  EKG None  Radiology DG Wrist Complete Right  Result Date: 05/13/2022 CLINICAL DATA:  [place] 2 nights ago off a curb and tried to catch self with right wrist and hand. Pain, swelling, and deformity noted. EXAM: RIGHT WRIST - COMPLETE 3+ VIEW COMPARISON:  None available FINDINGS: There is diffuse decreased bone mineralization. Moderate  radiocarpal joint space narrowing. 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 There are mild-to-moderate chronic cystic changes deep to each fracture line corticated margin. Moderate linear triquetral joint space narrowing and subchondral cystic change. Moderate to high-grade triscaphe and moderate first and second carpometacarpal joint space narrowing. Overlapping bones limits evaluation for an acute fracture, however no definitive acute fracture line is seen. Moderate to high-grade volar and mild-to-moderate dorsal wrist soft tissue swelling. Moderate triangular fibrocartilage complex chondrocalcinosis. IMPRESSION: 1. 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 2. Moderate to high-grade volar and mild-to-moderate dorsal wrist soft tissue swelling. Degenerative changes in overlap of bones limits evaluation for an acute fracture, however no definitive acute fracture line is seen. Electronically Signed   By: Yvonne Kendall M.D.   On: 05/13/2022 10:55    Procedures Procedures    Medications Ordered in ED Medications  ibuprofen (ADVIL) tablet 600 mg (has no administration in time range)    ED Course/ Medical Decision Making/ A&P                           Medical Decision Making Problems Addressed: Closed displaced fracture of scaphoid of right wrist with malunion, unspecified portion of scaphoid, subsequent encounter: acute illness or injury that poses a threat to life or bodily functions Wrist sprain, right, initial encounter: acute illness or injury that poses a threat to life or bodily functions  Amount and/or Complexity of Data Reviewed Radiology: ordered and independent interpretation performed.  Risk Prescription drug management.   Patient presented to ED for evaluation of a wrist injury.  Patient  has swelling and tenderness in his right wrist joint.  He does not have any tenderness in the elbow or distally.  No signs of any laceration.  Findings and presentation are not suggestive of infection.  X-ray does show arthritic changes as well as malunion of an occult appearing scaphoid fracture.  Will place the patient in a splint to immobilize.  He does not recall any old injuries.  There certainly could be an occult fracture in addition to the more chronic appearing findings.  We will have him follow-up with orthopedics.  Patient requested nonnarcotic pain medications because of his        Final Clinical Impression(s) / ED Diagnoses  Final diagnoses:  Wrist sprain, right, initial encounter  Closed displaced fracture of scaphoid of right wrist with malunion, unspecified portion of scaphoid, subsequent encounter    Rx / DC Orders ED Discharge Orders          Ordered    ibuprofen (ADVIL) 600 MG tablet  Every 8 hours PRN        05/13/22 1153              Dorie Rank, MD 05/13/22 1156

## 2022-05-13 NOTE — ED Triage Notes (Signed)
Pt states he accidentally tripped over a curb on Monday. States he landed on his right wrist. Pt denies any other injury. Pt is AxOx4.

## 2024-06-26 DEATH — deceased
